# Patient Record
Sex: Male | Born: 1975 | Race: White | Hispanic: No | Marital: Married | Smoking: Former smoker
Health system: Southern US, Community
[De-identification: ages and names within clinical notes are randomized; demographics above are authoritative.]

## PROBLEM LIST (undated history)

## (undated) DIAGNOSIS — F329 Major depressive disorder, single episode, unspecified: Secondary | ICD-10-CM

## (undated) DIAGNOSIS — G2581 Restless legs syndrome: Secondary | ICD-10-CM

## (undated) DIAGNOSIS — I319 Disease of pericardium, unspecified: Secondary | ICD-10-CM

## (undated) DIAGNOSIS — E785 Hyperlipidemia, unspecified: Secondary | ICD-10-CM

## (undated) DIAGNOSIS — F32A Depression, unspecified: Secondary | ICD-10-CM

## (undated) DIAGNOSIS — F419 Anxiety disorder, unspecified: Secondary | ICD-10-CM

## (undated) HISTORY — PX: OTHER SURGICAL HISTORY: SHX169

## (undated) HISTORY — DX: Restless legs syndrome: G25.81

## (undated) HISTORY — DX: Depression, unspecified: F32.A

## (undated) HISTORY — DX: Major depressive disorder, single episode, unspecified: F32.9

## (undated) HISTORY — PX: VASECTOMY: SHX75

## (undated) HISTORY — DX: Disease of pericardium, unspecified: I31.9

## (undated) HISTORY — DX: Hyperlipidemia, unspecified: E78.5

## (undated) HISTORY — DX: Anxiety disorder, unspecified: F41.9

---

## 2007-03-28 ENCOUNTER — Ambulatory Visit: Payer: Self-pay | Admitting: Family Medicine

## 2007-03-28 DIAGNOSIS — N509 Disorder of male genital organs, unspecified: Secondary | ICD-10-CM | POA: Insufficient documentation

## 2007-03-28 DIAGNOSIS — G2581 Restless legs syndrome: Secondary | ICD-10-CM | POA: Insufficient documentation

## 2007-03-30 ENCOUNTER — Encounter: Payer: Self-pay | Admitting: Family Medicine

## 2007-04-01 LAB — CONVERTED CEMR LAB
Albumin: 5.1 g/dL (ref 3.5–5.2)
BUN: 11 mg/dL (ref 6–23)
CO2: 24 meq/L (ref 19–32)
Calcium: 9.3 mg/dL (ref 8.4–10.5)
Chloride: 104 meq/L (ref 96–112)
Creatinine, Ser: 0.89 mg/dL (ref 0.40–1.50)
Glucose, Bld: 89 mg/dL (ref 70–99)
HCT: 44.6 % (ref 39.0–52.0)
Hemoglobin: 14.9 g/dL (ref 13.0–17.0)
RBC: 4.97 M/uL (ref 4.22–5.81)
WBC: 4.8 10*3/uL (ref 4.0–10.5)

## 2007-04-02 ENCOUNTER — Encounter: Payer: Self-pay | Admitting: Family Medicine

## 2007-04-04 LAB — CONVERTED CEMR LAB: TSH: 2.048 microintl units/mL (ref 0.350–5.50)

## 2010-01-10 ENCOUNTER — Ambulatory Visit: Payer: Self-pay | Admitting: Family Medicine

## 2010-01-11 ENCOUNTER — Telehealth: Payer: Self-pay | Admitting: Family Medicine

## 2010-02-08 ENCOUNTER — Ambulatory Visit: Payer: Self-pay | Admitting: Family Medicine

## 2010-02-08 DIAGNOSIS — M79609 Pain in unspecified limb: Secondary | ICD-10-CM | POA: Insufficient documentation

## 2010-02-09 ENCOUNTER — Encounter: Payer: Self-pay | Admitting: Family Medicine

## 2010-11-23 NOTE — Progress Notes (Signed)
Summary: not feeling any better  Phone Note Call from Patient Call back at Home Phone [phone]   Caller: Patient Call For: Nani Gasser MD Summary of Call: pt calls and says he is not feeling any better- right side nasal passage bleeding more, cheeks sore. Told to call Initial call taken by: Kathlene November,  January 11, 2010 2:58 PM  Follow-up for Phone Call        Rx Called In Follow-up by: Nani Gasser MD,  January 11, 2010 3:30 PM  Additional Follow-up for Phone Call Additional follow up Details #1::        Pt notified. Additional Follow-up by: Kathlene November,  January 11, 2010 3:58 PM    New/Updated Medications: AMOXICILLIN 500 MG CAPS (AMOXICILLIN) Take 1 tablet by mouth three times a day for 10 days Prescriptions: AMOXICILLIN 500 MG CAPS (AMOXICILLIN) Take 1 tablet by mouth three times a day for 10 days  #30 x 0   Entered and Authorized by:   Nani Gasser MD   Signed by:   Nani Gasser MD on 01/11/2010   Method used:   Electronically to        [facility]       [address]       [place], [place]  [address]       Ph: [phone]       Fax: [phone]   RxID:   [id number]

## 2010-11-23 NOTE — Consult Note (Signed)
Summary: [facility]  [facility]   Imported By: Lanelle Bal 02/17/2010 10:58:45  _____________________________________________________________________  External Attachment:    Type:   Image     Comment:   External Document

## 2010-11-23 NOTE — Assessment & Plan Note (Signed)
Summary: URI   Vital Signs:  Patient profile:   35 year old male Height:      70 inches Weight:      164 pounds BMI:     23.62 O2 Sat:      100 % on Room air Temp:     98.5 degrees F oral Pulse rate:   71 / minute BP sitting:   103 / 57  (left arm) Cuff size:   large  Vitals Entered By: Kathlene November (January 10, 2010 10:57 AM)  O2 Flow:  Room air CC: sinus congestion, sinus pressure, bodyaches, H/A, sore throat, cough for 6 days now   Primary Care Provider:  Linford Arnold, C  CC:  sinus congestion, sinus pressure, bodyaches, H/A, sore throat, and cough for 6 days now.  History of Present Illness: Dale Hicks is a 35 year old male presenting with sore throat, runny nose, cold chills, HA, cough, muscle aches x 6 days. He has been bringing up some green phlegm with his cough and has had some blood when blowing his nose. Sinus pressure and frontal headache. No fever to his knowledge. His son recently had his first day of daycare. Was congested but felt better after 5 days. Using dayquail for symptom relief.  Throat is better.    Current Medications (verified): 1)  Requip 0.25 Mg  Tabs (Ropinirole Hydrochloride) .... Take 1 Tablet By Mouth Once A Day At Bedtime  Allergies (verified): No Known Drug Allergies  Comments:  Nurse/Medical Assistant: The patient's medications and allergies were reviewed with the patient and were updated in the Medication and Allergy Lists. Kathlene November (January 10, 2010 10:59 AM)  Physical Exam  General:  Pleasant well-appearing male in no acute distress.  Head:  Normocephalic and atraumatic. Male-pattern balding.  Eyes:  Sclera clear with no corneal or conjunctival inflammation noted.  Ears:  Otoscopic examination reveals clear canals, tympanic membranes are intact bilaterally without bulging, retraction, inflammation or discharge. Hearing is grossly normal bilaterally. Nose:  Nasal turbinates slightly erythematous and swollen. Clear discharge. No maxillary or  frontal sinus tenderness.  Mouth:  Oral mucosa and oropharynx without lesions or exudates. Neck:  Supple with no lymphadenopathy.  Lungs:  Normal respiratory effort, chest expands symmetrically. Lungs are clear to auscultation, no crackles or wheezes. Heart:  Normal rate and regular rhythm. S1 and S2 normal without gallop, murmur, click, rub or other extra sounds. Pulses:  2+ radial pulses bilaterally.  Skin:  no rashes.   Cervical Nodes:  No lymphadenopathy noted Psych:  Interacting appropriately with normal concentration and attention.    Impression & Recommendations:  Problem # 1:  ACUTE NASOPHARYNGITIS (ICD-460) Acute illness with likely viral etiology. Advised patient to use vaseline or saline spray to prevent nasal dryness and bleeding. Use Tylenol or Motrin for HAs and body aches. Follow up if symptoms worsen over the next week.   Complete Medication List: 1)  Requip 0.25 Mg Tabs (Ropinirole hydrochloride) .... Take 1 tablet by mouth once a day at bedtime

## 2010-11-23 NOTE — Assessment & Plan Note (Signed)
Summary: Wants custom inserts.    Vital Signs:  Patient profile:   35 year old male Height:      70 inches Weight:      160 pounds Pulse rate:   67 / minute BP sitting:   104 / 59  (left arm) Cuff size:   regular  Vitals Entered By: Kathlene November (February 08, 2010 11:08 AM) CC: needs referral to podiatrists for shoe inserts   Primary Care Provider:  Linford Arnold, C  CC:  needs referral to podiatrists for shoe inserts.  History of Present Illness: needs referral to podiatrists for shoe inserts.  Went to the [employer] and they recommend custom inserts. Recently started running and now getting shin splints.  Running about  4 miles a day.  Had to stop because of shin splints. Occ knee pain.    Current Medications (verified): 1)  None  Allergies (verified): No Known Drug Allergies  Comments:  Nurse/Medical Assistant: The patient's medications and allergies were reviewed with the patient and were updated in the Medication and Allergy Lists. Kathlene November (February 08, 2010 11:09 AM)   Impression & Recommendations:  Problem # 1:  FOOT PAIN, BILATERAL (ICD-729.5)  Dsicussed that I think he would be best suited to sports medicine referral for custom inserts. Will refer to [facility] in [facility].    Orders: Sports Medicine (Sports Med)

## 2010-12-06 ENCOUNTER — Ambulatory Visit
Admission: RE | Admit: 2010-12-06 | Discharge: 2010-12-06 | Disposition: A | Discharge status: Home / Self Care | Payer: 59 | Source: Ambulatory Visit | Attending: Family Medicine | Admitting: Family Medicine

## 2010-12-06 ENCOUNTER — Encounter: Payer: Self-pay | Admitting: Family Medicine

## 2010-12-06 ENCOUNTER — Other Ambulatory Visit: Payer: Self-pay | Admitting: Family Medicine

## 2010-12-06 ENCOUNTER — Ambulatory Visit (INDEPENDENT_AMBULATORY_CARE_PROVIDER_SITE_OTHER): Payer: Self-pay | Admitting: Family Medicine

## 2010-12-06 DIAGNOSIS — R0602 Shortness of breath: Secondary | ICD-10-CM

## 2010-12-06 LAB — CONVERTED CEMR LAB
Hemoglobin: 14.8 g/dL (ref 13.0–17.0)
Lymphocytes Relative: 35 % (ref 12–46)
Monocytes Absolute: 0.5 10*3/uL (ref 0.1–1.0)
Monocytes Relative: 9 % (ref 3–12)
Neutro Abs: 3.4 10*3/uL (ref 1.7–7.7)
Neutrophils Relative %: 56 % (ref 43–77)
RBC: 4.82 M/uL (ref 4.22–5.81)

## 2010-12-07 LAB — CONVERTED CEMR LAB
ALT: 49 units/L (ref 0–53)
BUN: 12 mg/dL (ref 6–23)
CO2: 30 meq/L (ref 19–32)
Creatinine, Ser: 0.84 mg/dL (ref 0.40–1.50)
Total Bilirubin: 0.5 mg/dL (ref 0.3–1.2)

## 2010-12-14 NOTE — Assessment & Plan Note (Signed)
Summary: shortness of breath   Vital Signs:  Patient profile:   35 year old male Height:      70 inches Weight:      164 pounds BMI:     23.62 O2 Sat:      100 % on Room air Temp:     98.2 degrees F oral Pulse rate:   70 / minute BP sitting:   99 / 62  (right arm) Cuff size:   regular  Vitals Entered By: Avon Gully CMA, Duncan Dull) (December 06, 2010 10:11 AM)  O2 Flow:  Room air  Serial Vital Signs/Assessments:  Comments: 10:48 AM [phone] By: Avon Gully CMA, (AAMA)  10:49 AM pt is in the green zone By: Avon Gully CMA, (AAMA)   CC: sob x 1-2 weeks at night or when working out   Primary Care Provider:  Linford Arnold, C  CC:  sob x 1-2 weeks at night or when working out.  History of Present Illness: sob x 1-2 weeks at night or when working out. Feels like in the center of his chest.  Feels like when gets  scared feels like "needs to take a bbreath"  Started initally when had a viral illness. Last couple of week has been worse and has not had any other URI sxs. No daytime sxs  usually except when working out. Happens at night when sitting watching TV.  No change in anxiousness or stress levels.  No prior hx of asthma.  No CP or palpitations. Feels like chset is a little tight when happens. Can take about 20 min for it to go away after working out.  Usually lasts 5-10 mintues.  Taking a deep breath helps some.  NO prior hx of heart problems or disease. Had a stress test at age 3 for CP and that was normal. Was likely pericarditis at that time. NO fever.   Current Medications (verified): 1)  None  Allergies (verified): No Known Drug Allergies  Comments:  Nurse/Medical Assistant: The patient's medications and allergies were reviewed with the patient and were updated in the Medication and Allergy Lists. Avon Gully CMA, Duncan Dull) (December 06, 2010 10:12 AM)  Past History:  Family History: Last updated: 03/28/2007 GF with MI in his 30s Cousin with  DM Mother with cholesterol and HTN.    Physical Exam  General:  Well-developed,well-nourished,in no acute distress; alert,appropriate and cooperative throughout examination Head:  Normocephalic and atraumatic without obvious abnormalities. No apparent alopecia or balding. Eyes:  No corneal or conjunctival inflammation noted. EOMI. Perrla. Ears:  External ear exam shows no significant lesions or deformities.  Otoscopic examination reveals clear canals, tympanic membranes are intact bilaterally without bulging, retraction, inflammation or discharge. Hearing is grossly normal bilaterally. Nose:  External nasal examination shows no deformity or inflammation. Nasal mucosa are pink and moist without lesions or exudates. Mouth:  Oral mucosa and oropharynx without lesions or exudates.  Teeth in good repair. Neck:  No deformities, masses, or tenderness noted. NO tM.  Lungs:  Normal respiratory effort, chest expands symmetrically. Lungs are clear to auscultation, no crackles or wheezes. Heart:  Normal rate and regular rhythm. S1 and S2 normal without gallop, murmur, click, rub or other extra sounds. Pulses:  RAdial 2+ bialt.  Neurologic:  alert & oriented X3.   Skin:  no rashes.   Cervical Nodes:  No lymphadenopathy noted Psych:  Cognition and judgment appear intact. Alert and cooperative with normal attention span and concentration. No apparent delusions, illusions, hallucinations   Impression &  Recommendations:  Problem # 1:  DYSPNEA (ICD-786.05)  Unclear etiology. He does not feel this si stress related. No premature heart dz or lung dz in his family. No prior hx of heart problems or asthma. No URI sxs or fever. No CP.  Will check EKG, peak flows and will get a CXR.  EKG shows rate of 68 bpm, no acute changes, NSR. Peak flows were normal.  If CXR is normla then will see if sxs resolve over the next 2 weeks. Pt to call if gets worse. He voiced his understanding.   If not resolved in a couple of  weeks then consider spirometry and further cardiac workup.  Also consider reflux thought slighl unusual presentation.  Orders: T-Chest x-ray, 2 views (71020) EKG w/ Interpretation (93000) T-CBC w/Diff (60630-16010) T-Comprehensive Metabolic Panel (93235-57322) Peak Flow Rate (94150)  Patient Instructions: 1)  We will call you with the Chest xray report.  2)  Call if your symptoms get worse.    Orders Added: 1)  T-Chest x-ray, 2 views [71020] 2)  Est. Patient Level IV [02542] 3)  EKG w/ Interpretation [93000] 4)  T-CBC w/Diff [70623-76283] 5)  T-Comprehensive Metabolic Panel [80053-22900] 6)  Peak Flow Rate [94150]

## 2010-12-23 ENCOUNTER — Ambulatory Visit (INDEPENDENT_AMBULATORY_CARE_PROVIDER_SITE_OTHER): Payer: 59 | Admitting: Family Medicine

## 2010-12-23 ENCOUNTER — Encounter: Payer: Self-pay | Admitting: Family Medicine

## 2010-12-23 DIAGNOSIS — J209 Acute bronchitis, unspecified: Secondary | ICD-10-CM

## 2010-12-23 DIAGNOSIS — R0602 Shortness of breath: Secondary | ICD-10-CM

## 2010-12-29 ENCOUNTER — Telehealth: Payer: Self-pay | Admitting: Family Medicine

## 2010-12-29 NOTE — Assessment & Plan Note (Signed)
Summary: F/u on previous Visit for S.o.B.    Vital Signs:  Patient profile:   35 year old male Height:      70 inches Weight:      164 pounds BMI:     23.62 O2 Sat:      100 % on Room air Pulse rate:   90 / minute BP sitting:   107 / 66  (right arm)  Vitals Entered By: Payton Spark CMA (December 23, 2010 8:19 AM)  O2 Flow:  Room air CC: F/U SOB   Primary Care Provider:  Linford Arnold, C  CC:  F/U SOB.  History of Present Illness: SOB is better.  Still feeling some tightness in the center of his chest.  Occ feels like in the epigastric area. Yesterday felt it all morning.  No pain. No fever. Gets a litte out of braeth at the end of a long sentence.  Has had a little bit of heart burna nd boutht some TUMS.   Allergies: No Known Drug Allergies  Physical Exam  General:  Well-developed,well-nourished,in no acute distress; alert,appropriate and cooperative throughout examination Mouth:  Oral mucosa and oropharynx without lesions or exudates.  Teeth in good repair. Lungs:  Normal respiratory effort, chest expands symmetrically. Lungs are clear to auscultation, no crackles or wheezes. Heart:  Normal rate and regular rhythm. S1 and S2 normal without gallop, murmur, click, rub or other extra sounds. Skin:  no rashes.   Cervical Nodes:  No lymphadenopathy noted Psych:  Cognition and judgment appear intact. Alert and cooperative with normal attention span and concentration. No apparent delusions, illusions, hallucinations   Impression & Recommendations:  Problem # 1:  ACUTE BRONCHITIS (ICD-466.0) Will tx with Zpack.  He did have bronchitis on CXR and we held off on ABX as is usually viral but he is only a little better. Also will consider tiral of PPI. Given 5 days of dexilant and he is now having occ epigastric discomfort and more recently reflus sxs.  His updated medication list for this problem includes:    Zithromax Z-pak 250 Mg Tabs (Azithromycin) .[facility]... Take as directed.  Problem #  2:  DYSPNEA (ICD-786.05) Will tx with Zpack.  He did have bronchitis on CXR and we held off on ABX as is usually viral but he is only a little better. Also will consider tiral of PPI. Given 5 days of dexilant and he is now having occ epigastric discomfort and more recently reflus sxs.  Call fi not better in one week.    Complete Medication List: 1)  Zithromax Z-pak 250 Mg Tabs (Azithromycin) .... Take as directed.  Patient Instructions: 1)  Can start the dexilant sample once a day in the AM with breakfast.  2)  Call if not better in one week.  Prescriptions: ZITHROMAX Z-PAK 250 MG TABS (AZITHROMYCIN) Take as directed.  #1 pack x 0   Entered and Authorized by:   Nani Gasser MD   Signed by:   Nani Gasser MD on 12/23/2010   Method used:   Electronically to        [facility]       [address]       [place], [place]  [address]       Ph: [phone]       Fax: [phone]   RxID:   [id number]    Orders Added: 1)  Est. Patient Level III [84132]

## 2011-01-03 NOTE — Progress Notes (Signed)
Summary: No better  Phone Note Call from Patient   Caller: Patient Summary of Call: Pt LMOM stating he is still not feeling any better and would like referral(s) that you suggested at last OV. Please advise. Initial call taken by: Payton Spark CMA,  December 29, 2010 2:27 PM

## 2011-01-05 ENCOUNTER — Ambulatory Visit: Payer: 59 | Admitting: Family Medicine

## 2011-01-13 ENCOUNTER — Telehealth: Payer: Self-pay | Admitting: *Deleted

## 2011-01-13 NOTE — Telephone Encounter (Signed)
Call pt: Why does he want his thyroid checked. I have to have a reason or "code:  To order it or insurance won't pay

## 2011-01-13 NOTE — Telephone Encounter (Signed)
Pt LMOM requesting to have thyroid checked. Please advise.

## 2011-01-16 NOTE — Telephone Encounter (Signed)
Called and left message for pt to call back with reason and that he may need appt to discusss why  He need thyroid checked

## 2011-01-24 ENCOUNTER — Encounter: Payer: Self-pay | Admitting: Pulmonary Disease

## 2011-01-24 ENCOUNTER — Ambulatory Visit (INDEPENDENT_AMBULATORY_CARE_PROVIDER_SITE_OTHER): Payer: 59 | Admitting: Pulmonary Disease

## 2011-01-24 DIAGNOSIS — R0602 Shortness of breath: Secondary | ICD-10-CM

## 2011-01-24 NOTE — Patient Instructions (Addendum)
Your breathing test is normal No cause is apparent for your symptoms Take zyrtec for allergies x 4 weeks If worse or persistent symptoms , call back in 1 month

## 2011-01-24 NOTE — Progress Notes (Signed)
  Subjective:    Patient ID: Dale Hicks, male    DOB: October 04, 1976, 35 y.o.   MRN: [id number]  HPI 34/M, ex smoker, for evaluation of dyspnea Difficulty breathing in , shortness of breath better, feels foggy almost every day, has not been to the gym in 3 weeks Sudden onset, bought a '79 camero CXR 12/06/10 nml  Seen in Feb'12  >> sob x 1-2 weeks at night or when working out. Feels like in the center of his chest. Feels like when gets scared feels like "needs to take a bbreath" Started initally when had a viral illness. Feels like chset is a little tight when happens. Can take about 20 min for it to go away after working out. Usually lasts 5-10 mintues. Taking a deep breath helps some. NO prior hx of heart problems or disease. Had a stress test at age 44 for CP and that was normal. Was likely pericarditis at that time.>> CXR- peribronchial thickening, reviewed   March >> z-pak & PPI for GERD - no relief  Spirometry nml     Review of Systems  Constitutional: Negative for fever, appetite change and unexpected weight change.  HENT: Negative for ear pain, congestion, sore throat, rhinorrhea, sneezing, trouble swallowing, dental problem and postnasal drip.   Eyes: Negative for redness.  Respiratory: Positive for shortness of breath. Negative for cough and wheezing.   Cardiovascular: Negative for chest pain, palpitations and leg swelling.  Gastrointestinal: Negative for nausea, vomiting, abdominal pain and diarrhea.  Genitourinary: Negative for dysuria and urgency.  Musculoskeletal: Negative for joint swelling.  Skin: Negative for rash.  Neurological: Positive for weakness. Negative for syncope and headaches.  Hematological: Does not bruise/bleed easily.  Psychiatric/Behavioral: Negative for dysphoric mood. The patient is not nervous/anxious.        Objective:   Physical Exam Gen. Pleasant, well-nourished, in no distress, normal affect ENT - no lesions, no post nasal drip Neck: No JVD,  no thyromegaly, no carotid bruits Lungs: no use of accessory muscles, no dullness to percussion, clear without rales or rhonchi  Cardiovascular: Rhythm regular, heart sounds  normal, no murmurs or gallops, no peripheral edema Abdomen: soft and non-tender, no hepatosplenomegaly, BS normal. Musculoskeletal: No deformities, no cyanosis or clubbing Neuro:  alert, non focal        Assessment & Plan:

## 2011-01-24 NOTE — Assessment & Plan Note (Signed)
No obvious cause - doubt asthma- no airway obstruction CXr nml No obvious GERD, upper airway issues No risk factors for PE, doubt this represents cardiac disease He does not report obvious stressors or anxiety. Best strategy may be to observe at this time,use zyrtec for allergies.  he will keep Peak flow record & call us back for persistent symptoms in 1  month

## 2011-01-24 NOTE — Progress Notes (Signed)
  Subjective:    Patient ID: Dale Hicks, male    DOB: May 16, 1976, 35 y.o.   MRN: [id number]  HPI    Review of Systems  Constitutional: Positive for fatigue. Negative for fever, appetite change and unexpected weight change.  HENT: Negative for ear pain, congestion, sore throat, rhinorrhea, sneezing, trouble swallowing, dental problem and postnasal drip.   Eyes: Negative for redness.  Respiratory: Positive for cough and shortness of breath. Negative for wheezing.   Cardiovascular: Positive for chest pain. Negative for palpitations and leg swelling.  Gastrointestinal: Positive for abdominal pain. Negative for nausea, vomiting and diarrhea.  Genitourinary: Negative for dysuria and urgency.  Musculoskeletal: Negative for joint swelling.  Skin: Negative for rash.  Neurological: Positive for light-headedness. Negative for syncope and headaches.  Hematological: Does not bruise/bleed easily.  Psychiatric/Behavioral: Negative for dysphoric mood. The patient is not nervous/anxious.        Objective:   Physical Exam        Assessment & Plan:

## 2011-02-14 ENCOUNTER — Other Ambulatory Visit: Payer: Self-pay | Admitting: Family Medicine

## 2011-02-14 ENCOUNTER — Ambulatory Visit (INDEPENDENT_AMBULATORY_CARE_PROVIDER_SITE_OTHER): Payer: 59 | Admitting: Family Medicine

## 2011-02-14 ENCOUNTER — Encounter: Payer: Self-pay | Admitting: Family Medicine

## 2011-02-14 VITALS — BP 117/67 | HR 78 | Ht 69.5 in | Wt 163.0 lb

## 2011-02-14 DIAGNOSIS — R5381 Other malaise: Secondary | ICD-10-CM

## 2011-02-14 DIAGNOSIS — R5383 Other fatigue: Secondary | ICD-10-CM | POA: Insufficient documentation

## 2011-02-14 DIAGNOSIS — R42 Dizziness and giddiness: Secondary | ICD-10-CM | POA: Insufficient documentation

## 2011-02-14 NOTE — Progress Notes (Signed)
  Subjective:    Patient ID: Dale Hicks, male    DOB: 01-Dec-1975, 35 y.o.   MRN: [id number]  HPI Feels foggy headed. Fatigued.  For the first hour feels ok.  Then starts to feel spacy and not focused.  Hard to concentrate.  Vision changes a little bit.  Feels almost like being groggy all day.  No snoring. No OTC meds but occ advil. This has been happening for a couple of months.  It is daily. He did see pulm for his SOB.  Had normal spirometry. Eyes will start to water some.  Not snoring. More irritable. Maybe not a happy or sad, just in the middle. His mom would like his thyroid checked.  He doesn't feel stressed, but says his sister feels he is overly stressed. No numbness or tingling or weakness in his arms.     Review of Systems     Objective:   Physical Exam  Constitutional: He is oriented to person, place, and time. He appears well-developed and well-nourished.  HENT:  Head: Normocephalic and atraumatic.  Right Ear: External ear normal.  Left Ear: External ear normal.  Nose: Nose normal.  Mouth/Throat: Oropharynx is clear and moist.  Eyes: Conjunctivae and EOM are normal. Pupils are equal, round, and reactive to light.  Neck: Neck supple. No thyromegaly present.  Cardiovascular: Normal rate, regular rhythm and normal heart sounds.        Radial pulse 2+ bilat.  No carotid bruits.   Pulmonary/Chest: Effort normal and breath sounds normal.  Lymphadenopathy:    He has no cervical adenopathy.  Neurological: He is alert and oriented to person, place, and time. He has normal strength and normal reflexes. He displays normal reflexes. No cranial nerve deficit. He exhibits normal muscle tone. He displays a negative Romberg sign. Coordination and gait normal.  Skin: Skin is warm and dry.  Psychiatric: He has a normal mood and affect. His behavior is normal. Judgment and thought content normal.          Assessment & Plan:

## 2011-02-14 NOTE — Assessment & Plan Note (Signed)
Discussed that I am unclear about the cause of his feeling "foggy" and unfocused. Discussed this could be related to depression.  It doesn't sound consistent with a brain tumor or sinus infection.  I think this is his overall mood. Will get labs to check his thyroid and rule out anemia but if normal then consider treating depression to see if he feels better. I also explained that the sensation of SOB can also be related to his mood as well, esp since had normal spirometry.

## 2011-02-14 NOTE — Patient Instructions (Signed)
WE will call you with your lab results. Follow up in 2 weeks.

## 2011-02-14 NOTE — Assessment & Plan Note (Addendum)
Discussed that this is not vertigo. It is a near syncopal sensation that is not positional or orthostatic.  Since it consistantly happens when he gets on the highway to drive I really think this is anxiety.

## 2011-02-15 ENCOUNTER — Encounter: Payer: Self-pay | Admitting: Pulmonary Disease

## 2011-02-16 ENCOUNTER — Telehealth: Payer: Self-pay | Admitting: Family Medicine

## 2011-02-16 LAB — COMPLETE METABOLIC PANEL WITH GFR
ALT: 77 U/L — ABNORMAL HIGH (ref 0–53)
Albumin: 5.1 g/dL (ref 3.5–5.2)
CO2: 29 mEq/L (ref 19–32)
Calcium: 9.9 mg/dL (ref 8.4–10.5)
Chloride: 101 mEq/L (ref 96–112)
GFR, Est African American: >60 mL/min (ref >60)
GFR, Est Non African American: >60 mL/min (ref >60)
Glucose, Bld: 78 mg/dL (ref 70–99)
Sodium: 140 mEq/L (ref 135–145)
Total Bilirubin: 0.6 mg/dL (ref 0.3–1.2)
Total Protein: 7 g/dL (ref 6.0–8.3)

## 2011-02-16 LAB — VITAMIN B12: Vitamin B-12: 654 pg/mL (ref 211–911)

## 2011-02-16 LAB — CBC
Hemoglobin: 14.4 g/dL (ref 13.0–17.0)
MCH: 29.4 pg (ref 26.0–34.0)
MCHC: 33.2 g/dL (ref 30.0–36.0)
Platelets: 227 10*3/uL (ref 150–400)
RDW: 12.9 % (ref 11.5–15.5)

## 2011-02-16 LAB — TSH: TSH: 2.234 u[IU]/mL (ref 0.350–4.500)

## 2011-02-16 LAB — VITAMIN D 25 HYDROXY (VIT D DEFICIENCY, FRACTURES): Vit D, 25-Hydroxy: 41 ng/mL (ref 30–89)

## 2011-02-16 NOTE — Telephone Encounter (Signed)
Call pt: Liver enzymes are a little high. Avoid all IBU, Advil, Motrin, Aleve products and alcohol nad recheck in 2 wk. Also call lab and add acute hep panel.

## 2011-02-20 NOTE — Telephone Encounter (Signed)
Pt notified and  Labs added

## 2011-02-21 LAB — HEPATITIS PANEL, ACUTE
Hep A IgM: NEGATIVE
Hep B C IgM: NEGATIVE
Hepatitis B Surface Ag: NEGATIVE

## 2011-02-22 ENCOUNTER — Telehealth: Payer: Self-pay | Admitting: Family Medicine

## 2011-02-22 NOTE — Telephone Encounter (Signed)
Pt.notified

## 2011-02-22 NOTE — Telephone Encounter (Signed)
Call pt: Neg acute hep panel. Let recheck liver this week or next.

## 2011-02-24 ENCOUNTER — Telehealth: Payer: Self-pay | Admitting: Family Medicine

## 2011-02-24 ENCOUNTER — Other Ambulatory Visit: Payer: 59

## 2011-02-24 DIAGNOSIS — R748 Abnormal levels of other serum enzymes: Secondary | ICD-10-CM

## 2011-02-24 NOTE — Telephone Encounter (Signed)
Pt wants to come in today to do labs.  He wants [place] to let him know if this okay .  Would like to come today.  Can you get lab order ready and contact patient. Plan:  Routed to Dr. Elder Cyphers, CMA Jarvis Newcomer, LPN Domingo Dimes

## 2011-02-24 NOTE — Telephone Encounter (Signed)
Order entered and pt can come today.notified pt

## 2011-02-25 LAB — COMPLETE METABOLIC PANEL WITH GFR
AST: 37 U/L (ref 0–37)
Albumin: 4.9 g/dL (ref 3.5–5.2)
Alkaline Phosphatase: 89 U/L (ref 39–117)
BUN: 12 mg/dL (ref 6–23)
GFR, Est Non African American: >60 mL/min (ref >60)
Potassium: 4 mEq/L (ref 3.5–5.3)
Sodium: 142 mEq/L (ref 135–145)
Total Bilirubin: 0.5 mg/dL (ref 0.3–1.2)
Total Protein: 7 g/dL (ref 6.0–8.3)

## 2011-02-27 ENCOUNTER — Telehealth: Payer: Self-pay | Admitting: Family Medicine

## 2011-02-27 NOTE — Telephone Encounter (Signed)
Call patient: Overall liver enzymes look much better. When in sinus tachycardia normal a second enzyme is almost down to normal. Let's recheck in one month. To make sure stable.

## 2011-02-28 ENCOUNTER — Ambulatory Visit: Payer: 59 | Admitting: Pulmonary Disease

## 2011-02-28 ENCOUNTER — Ambulatory Visit (INDEPENDENT_AMBULATORY_CARE_PROVIDER_SITE_OTHER): Payer: 59 | Admitting: Pulmonary Disease

## 2011-02-28 ENCOUNTER — Encounter: Payer: Self-pay | Admitting: Pulmonary Disease

## 2011-02-28 VITALS — BP 108/70 | HR 89 | Ht 70.0 in | Wt 160.0 lb

## 2011-02-28 DIAGNOSIS — R0602 Shortness of breath: Secondary | ICD-10-CM

## 2011-02-28 NOTE — Assessment & Plan Note (Signed)
No obvious cause - doubt asthma- no airway obstruction  CXr nml  No obvious GERD, upper airway issues  No risk factors for PE but will obtain CT angio for persistent symptoms, doubt this represents cardiac disease  If negative, focus on anxiety as a cause

## 2011-02-28 NOTE — Progress Notes (Signed)
  Subjective:    Patient ID: Dale Hicks, male    DOB: 01-27-1976, 35 y.o.   MRN: [id number]  HPI 34/M, ex smoker, for evaluation of dyspnea  Difficulty breathing in , shortness of breath better, feels foggy almost every day, has not been to the gym in 3 weeks  Sudden onset, bought a '79 camero  CXR 12/06/10 nml  Seen in Feb'12 >> sob x 1-2 weeks at night or when working out. Feels like in the center of his chest. Feels like when gets scared feels like "needs to take a breath" Started initally when had a viral illness. Feels like chset is a little tight when happens. Can take about 20 min for it to go away after working out. Usually lasts 5-10 mintues. Taking a deep breath helps some. NO prior hx of heart problems or disease. Had a stress test at age 62 for CP and that was normal. Was likely pericarditis at that time.>> CXR- peribronchial thickening, reviewed  March >> z-pak & PPI for GERD - no relief  Spirometry nml   02/28/2011 Zyrtec did not help Did not bring PF record but PF remained in the 450 range, continues to c/o chest heaviness, difficulty in taking a deep breath & head feels like in a 'fog ' as if he is lacking oxygen. He does not report obvious stressors or anxiety.  Did not desatn on walking around the office     Review of Systems Pt denies any significant  nasal congestion or excess secretions, fever, chills, sweats, unintended wt loss, pleuritic or exertional cp, orthopnea pnd or leg swelling.  Pt also denies any obvious fluctuation in symptoms with weather or environmental change or other alleviating or aggravating factors.    Pt denies any increase in rescue therapy over baseline, denies waking up needing it or having early am exacerbations or coughing/wheezing/ or dyspnea       Objective:   Physical Exam Gen. Pleasant, well-nourished, in no distress ENT - no lesions, no post nasal drip Neck: No JVD, no thyromegaly, no carotid bruits Lungs: no use of accessory muscles,  no dullness to percussion, clear without rales or rhonchi  Cardiovascular: Rhythm regular, heart sounds  normal, no murmurs or gallops, no peripheral edema Musculoskeletal: No deformities, no cyanosis or clubbing          Assessment & Plan:

## 2011-02-28 NOTE — Patient Instructions (Addendum)
Ambulatory oxygen levels were good CT scan of chest to look for blood clots If OK, would suggest treatment trial for anxiety & stress control

## 2011-03-01 ENCOUNTER — Ambulatory Visit (INDEPENDENT_AMBULATORY_CARE_PROVIDER_SITE_OTHER)
Admission: RE | Admit: 2011-03-01 | Discharge: 2011-03-01 | Disposition: A | Discharge status: Home / Self Care | Payer: 59 | Source: Ambulatory Visit | Attending: Pulmonary Disease | Admitting: Pulmonary Disease

## 2011-03-01 ENCOUNTER — Telehealth: Payer: Self-pay | Admitting: Pulmonary Disease

## 2011-03-01 DIAGNOSIS — R0602 Shortness of breath: Secondary | ICD-10-CM

## 2011-03-01 MED ORDER — IOHEXOL 300 MG/ML  SOLN
100.0000 mL | Freq: Once | INTRAMUSCULAR | Status: AC | PRN
  Administered 2011-03-01: 100 mL via INTRAVENOUS

## 2011-03-01 NOTE — Telephone Encounter (Signed)
Pt.notified

## 2011-03-01 NOTE — Telephone Encounter (Signed)
Per [facility] [employer] in [place] call report negative for PE

## 2011-03-07 ENCOUNTER — Encounter: Payer: Self-pay | Admitting: Family Medicine

## 2011-03-07 NOTE — Progress Notes (Signed)
Quick Note:  I informed pt of RA's findings and recommendations. Pt verbalized understanding  ______ 

## 2011-03-07 NOTE — Telephone Encounter (Signed)
Dr. Vassie Loll notified, resulted on report.

## 2011-03-09 ENCOUNTER — Encounter: Payer: Self-pay | Admitting: Family Medicine

## 2011-03-09 ENCOUNTER — Ambulatory Visit (INDEPENDENT_AMBULATORY_CARE_PROVIDER_SITE_OTHER): Payer: 59 | Admitting: Family Medicine

## 2011-03-09 VITALS — BP 106/69 | HR 71 | Wt 161.0 lb

## 2011-03-09 DIAGNOSIS — F341 Dysthymic disorder: Secondary | ICD-10-CM

## 2011-03-09 DIAGNOSIS — F418 Other specified anxiety disorders: Secondary | ICD-10-CM

## 2011-03-09 MED ORDER — CITALOPRAM HYDROBROMIDE 20 MG PO TABS: 20.0000 mg | ORAL_TABLET | Freq: Every day | ORAL | Status: DC

## 2011-03-09 NOTE — Patient Instructions (Signed)
1/2 tab a day for one week, then increase to whole tab.  Follow-up in 3 weeks.

## 2011-03-09 NOTE — Progress Notes (Signed)
  Subjective:    Patient ID: Dale Hicks, male    DOB: 11-05-1975, 35 y.o.   MRN: [id number]  HPI Still feeling foggy and chest discomfort . Not sleeping well.  Restless sleep. Wakse up frequently.  Usually gets about 6 hours of sleep.  No thought sof hurting himself. Never seen a counselor before.   Saw pulmonology who ruled out asthma, GERD, etc.  he had a normal chest x-ray and a normal CT angiogram. They really felt this was related to anxiety so he is here to f/u today. Note he quit working out about 3 weeks ago because of his shortness of breath and chest discomfort. There he says he has restarted the last couple of days. He denies feeling overly stressed. No family history of depression or anxiety. No thoughts of hurting himself.   Review of Systems     Objective:   Physical Exam  Constitutional: He is oriented to person, place, and time. He appears well-developed and well-nourished.  HENT:  Head: Normocephalic and atraumatic.  Cardiovascular: Normal rate, regular rhythm and normal heart sounds.   Pulmonary/Chest: Effort normal and breath sounds normal.  Neurological: He is alert and oriented to person, place, and time.  Skin: Skin is warm and dry.  Psychiatric: He has a normal mood and affect.          Assessment & Plan:

## 2011-03-09 NOTE — Assessment & Plan Note (Signed)
Today his PHQ-9 score was 12 his GAD-7 score with 12. The specimen the moderate category for depression and anxiety. We discussed this diagnosis. We also discussed treatment such as regular exercise, counseling and medication. He said at this time he would suddenly start going back to the gym and he is interested in trying counseling. He says he not sure how much it may help him but he is willing to try to that he can feel better. He was also given a taking a medication. We discussed how SSRIs work and the benefits and risks of taking these medications. I did let him know if he has any thoughts of suicide he is to stop the medication immediately. Otherwise he can followup in 3-4 weeks and he can discuss how he is doing on the medication and adjust his treatment if needed. Patient agrees to current care plan.

## 2011-04-06 ENCOUNTER — Ambulatory Visit (INDEPENDENT_AMBULATORY_CARE_PROVIDER_SITE_OTHER): Payer: 59 | Admitting: Family Medicine

## 2011-04-06 ENCOUNTER — Encounter: Payer: Self-pay | Admitting: Family Medicine

## 2011-04-06 VITALS — BP 106/69 | HR 71 | Ht 70.0 in | Wt 160.0 lb

## 2011-04-06 DIAGNOSIS — R0602 Shortness of breath: Secondary | ICD-10-CM

## 2011-04-06 DIAGNOSIS — R7401 Elevation of levels of liver transaminase levels: Secondary | ICD-10-CM

## 2011-04-06 DIAGNOSIS — F341 Dysthymic disorder: Secondary | ICD-10-CM

## 2011-04-06 DIAGNOSIS — F418 Other specified anxiety disorders: Secondary | ICD-10-CM

## 2011-04-06 DIAGNOSIS — R748 Abnormal levels of other serum enzymes: Secondary | ICD-10-CM

## 2011-04-06 MED ORDER — CITALOPRAM HYDROBROMIDE 20 MG PO TABS: 30.0000 mg | ORAL_TABLET | Freq: Every day | ORAL | Status: DC

## 2011-04-06 NOTE — Assessment & Plan Note (Signed)
I really do not think that this is related to cardiac causes I will go ahead and refer him to cardiology to see if they feel this warrants a stress test. His hearing worried that this could be something besides anxiety. I did offer the option of waiting another month since we are increasing his medication to see if the shortness of breath improves that he would like to ahead and scheduled a cardiology referral.

## 2011-04-06 NOTE — Assessment & Plan Note (Addendum)
PHQ- 9 score of 8, and GAD-7 score of 7 today. Will inc dose to 1.5 tabs.  Discussed options. He has made great progess. F/U in 1 month. He is not interested in counseling at this time. I still think this is probably the primary cause of his shortness of breath.

## 2011-04-06 NOTE — Progress Notes (Signed)
  Subjective:    Patient ID: DEMARCUS THIELKE, male    DOB: [birth year], 35 y.o.   MRN: [id number]  HPI Doing well on teh celexa. No SE currently. Tolerating well. Sleep was better initially but still having difficulty falling asleep.  Feels he sleeps deeper. He is on a whole tab. Did have some inc anxiety the first 3 days he started the medication.  Would get hot flashes but that has resolved.  Felt more fatigued initially.   Starting to get SOB again and foggy headedness.  Says did get a little better initially after starting the medication, but now he feels that it is starting again.Marland Kitchen  Has had full pulm w/u that was negative. No reflux symptoms or stomach upset. Usually happens in the evening and at rest.  It does not happen was watching TV. It does not typically happen with activity. It is not disabling. He wonders if he could have a brain tumor.  Review of Systems     Objective:   Physical Exam  Constitutional: He is oriented to person, place, and time. He appears well-developed and well-nourished.  Cardiovascular: Normal rate, regular rhythm and normal heart sounds.   Pulmonary/Chest: Effort normal and breath sounds normal.  Neurological: He is alert and oriented to person, place, and time.  Skin: Skin is warm and dry.  Psychiatric: He has a normal mood and affect.          Assessment & Plan:  I tried to give him reassurance that it is unlikely for him to have a brain tumor. Typically this was present in other ways such as seizures or severe headaches instead of feeling "foggy" headed.

## 2011-04-07 ENCOUNTER — Telehealth: Payer: Self-pay | Admitting: Family Medicine

## 2011-04-07 DIAGNOSIS — R748 Abnormal levels of other serum enzymes: Secondary | ICD-10-CM

## 2011-04-07 LAB — HEPATIC FUNCTION PANEL
Albumin: 4.9 g/dL (ref 3.5–5.2)
Bilirubin, Direct: 0.1 mg/dL (ref 0.0–0.3)
Total Bilirubin: 0.6 mg/dL (ref 0.3–1.2)

## 2011-04-07 NOTE — Telephone Encounter (Signed)
Call patient: Liver function went back up a little bit. They come down almost to normal last time. I would like to schedule him for an ultrasound of his liver if he has not had this done. Please let me know if I need to enter the order.

## 2011-04-10 ENCOUNTER — Telehealth: Payer: Self-pay | Admitting: Family Medicine

## 2011-04-10 NOTE — Telephone Encounter (Signed)
Advised pt's wife of msg. Said to go ahead and sched U/S. Call [phone] w/appt

## 2011-04-10 NOTE — Telephone Encounter (Signed)
Pt called and is returning call to Gaylyn Lambert who is working with Dr. Linford Arnold today. Plan:  Routed call to Gaylyn Lambert. Jarvis Newcomer, LPN Domingo Dimes

## 2011-04-11 NOTE — Telephone Encounter (Signed)
U/S sched for 6/20 at 9:15 [facility]. Pt notified.

## 2011-04-12 ENCOUNTER — Telehealth: Payer: Self-pay | Admitting: Family Medicine

## 2011-04-12 ENCOUNTER — Ambulatory Visit
Admission: RE | Admit: 2011-04-12 | Discharge: 2011-04-12 | Disposition: A | Discharge status: Home / Self Care | Payer: 59 | Source: Ambulatory Visit | Attending: Family Medicine | Admitting: Family Medicine

## 2011-04-12 DIAGNOSIS — R748 Abnormal levels of other serum enzymes: Secondary | ICD-10-CM

## 2011-04-12 NOTE — Telephone Encounter (Signed)
Call pt: Dale Hicks is nl.  He doesn't have fatty Dale which is one of the most common causes of elevated Dale enzymes in young health people. No evidence of cirrhosis. I would like to get additional bloodwork to rule out other causes like hemochromatosis and thyroid disorder and celiac disease. I will fax down a lab slip and eating at any time. He will need to fast for 8 hours for this bloodwork.

## 2011-04-12 NOTE — Telephone Encounter (Signed)
Pts wife informed of pt lab results since pt not available.  Told need to do other labwork and fast for 8 hours prior.  Pt;s wife will give the instructions to her husband. Jarvis Newcomer, LPN Dom[facility] Dimes

## 2011-04-18 ENCOUNTER — Encounter: Payer: Self-pay | Admitting: *Deleted

## 2011-04-19 ENCOUNTER — Ambulatory Visit (INDEPENDENT_AMBULATORY_CARE_PROVIDER_SITE_OTHER): Payer: 59 | Admitting: Cardiology

## 2011-04-19 ENCOUNTER — Encounter: Payer: Self-pay | Admitting: Cardiology

## 2011-04-19 VITALS — BP 115/69 | HR 69 | Resp 18 | Ht 70.0 in | Wt 161.1 lb

## 2011-04-19 DIAGNOSIS — R0602 Shortness of breath: Secondary | ICD-10-CM

## 2011-04-19 DIAGNOSIS — R079 Chest pain, unspecified: Secondary | ICD-10-CM | POA: Insufficient documentation

## 2011-04-19 DIAGNOSIS — R0600 Dyspnea, unspecified: Secondary | ICD-10-CM

## 2011-04-19 NOTE — Assessment & Plan Note (Signed)
Symptoms atypical. Scheduled ETT.

## 2011-04-19 NOTE — Progress Notes (Signed)
HPI: 35 year old male with no prior cardiac history for evaluation of dyspnea. Chest CTA on Feb 28, 2011 showed no pulmonary embolus and was otherwise unremarkable. Spirometry in April of 2012 was normal. Previous TSH, hemoglobin, renal function and B12 normal. Liver functions mildly elevated. For the past 4 months the patient has had episodes of dyspnea. These occur mainly at rest. There is occasional associated chest tightness in the substernal area but this is not consistent. His symptoms last approximately 5 minutes and resolve spontaneously. No associated nausea or diaphoresis. He states he feels as though he cannot take a deep breath. He also notes increased dyspnea with exercise but no orthopnea, PND or pedal edema. He is not having exertional chest pain. No syncope. He also describes a "foggy sensation" over his eyes continuously. Because of the above we were asked to further evaluate.  Current Outpatient Prescriptions  Medication Sig Dispense Refill  . citalopram (CELEXA) 20 MG tablet Take 1.5 tablets (30 mg total) by mouth daily.  45 tablet  1    No Known Allergies  Past Medical History  Diagnosis Date  . RLS (restless legs syndrome)     hx of  . Pericarditis     Past Surgical History  Procedure Date  . Wisdom teeth removal     History   Social History  . Marital Status: Married    Spouse Name: Artist Pais    Number of Children: 1  . Years of Education: N/A   Occupational History  . IT Analyst     [employer]    Social History Main Topics  . Smoking status: Former Smoker -- 1.0 packs/day for 7 years    Types: Cigarettes    Quit date: 01/23/2010  . Smokeless tobacco: Current User    Types: Chew  . Alcohol Use: No  . Drug Use: No  . Sexually Active: Not on file   Other Topics Concern  . Not on file   Social History Narrative  . No narrative on file    Family History  Problem Relation Age of Onset  . Heart attack      GF in his 55s  . Diabetes Cousin   .  Hyperlipidemia Mother   . Hypertension Mother     ROS: no fevers or chills, productive cough, hemoptysis, dysphasia, odynophagia, melena, hematochezia, dysuria, hematuria, rash, seizure activity, orthopnea, PND, pedal edema, claudication. Remaining systems are negative.  Physical Exam: General:  Well developed/well nourished in NAD Skin warm/dry Patient not depressed No peripheral clubbing Back-normal HEENT-normal/normal eyelids Neck supple/normal carotid upstroke bilaterally; no bruits; no JVD; no thyromegaly chest - CTA/ normal expansion CV - RRR/normal S1 and S2; no murmurs, rubs or gallops;  PMI nondisplaced Abdomen -NT/ND, no HSM, no mass, + bowel sounds, no bruit 2+ femoral pulses, no bruits Ext-no edema, chords, 2+ DP Neuro-grossly nonfocal  ECG Sinus rhythm at a rate of 60, RV conduction delay, no ST changes.

## 2011-04-19 NOTE — Assessment & Plan Note (Signed)
Etiology unclear. Pulmonary functions and chest CTA unremarkable. Check echocardiogram to quantify LV systolic and diastolic function. Patient also with history of pericarditis although doubt constriction. If normal will not pursue further cardiac evaluation for dyspnea.

## 2011-04-19 NOTE — Patient Instructions (Signed)
Your physician has requested that you have an exercise tolerance test. For further information please visit [email or web address]. Please also follow instruction sheet, as given.  Your physician has requested that you have an echocardiogram. Echocardiography is a painless test that uses sound waves to create images of your heart. It provides your doctor with information about the size and shape of your heart and how well your heart's chambers and valves are working. This procedure takes approximately one hour. There are no restrictions for this procedure.   

## 2011-05-04 ENCOUNTER — Encounter: Payer: Self-pay | Admitting: Physician Assistant

## 2011-05-08 ENCOUNTER — Ambulatory Visit (INDEPENDENT_AMBULATORY_CARE_PROVIDER_SITE_OTHER): Payer: 59 | Admitting: Physician Assistant

## 2011-05-08 ENCOUNTER — Ambulatory Visit (HOSPITAL_COMMUNITY): Payer: 59 | Attending: Family Medicine | Admitting: Radiology

## 2011-05-08 DIAGNOSIS — I079 Rheumatic tricuspid valve disease, unspecified: Secondary | ICD-10-CM | POA: Insufficient documentation

## 2011-05-08 DIAGNOSIS — R072 Precordial pain: Secondary | ICD-10-CM

## 2011-05-08 DIAGNOSIS — R0600 Dyspnea, unspecified: Secondary | ICD-10-CM

## 2011-05-08 DIAGNOSIS — R0609 Other forms of dyspnea: Secondary | ICD-10-CM | POA: Insufficient documentation

## 2011-05-08 DIAGNOSIS — R0989 Other specified symptoms and signs involving the circulatory and respiratory systems: Secondary | ICD-10-CM | POA: Insufficient documentation

## 2011-05-08 DIAGNOSIS — R079 Chest pain, unspecified: Secondary | ICD-10-CM

## 2011-05-08 DIAGNOSIS — R5383 Other fatigue: Secondary | ICD-10-CM | POA: Insufficient documentation

## 2011-05-08 DIAGNOSIS — R5381 Other malaise: Secondary | ICD-10-CM | POA: Insufficient documentation

## 2011-05-08 NOTE — Progress Notes (Signed)
Exercise Treadmill Test  Pre-Exercise Testing Evaluation Rhythm: normal sinus  Rate: 62   PR:  .15 QRS:  .07  QT:  .43 QTc: .43           Test  Exercise Tolerance Test Ordering MD: Olga Millers, MD  Interpreting MD:  Tereso Newcomer, PA  Unique Test No: 1 Treadmill:  1  Indication for ETT: chest pain - rule out ischemia  Contraindication to ETT: No   Stress Modality: exercise - treadmill  Cardiac Imaging Performed: non   Protocol: standard Bruce - submaximal  Max BP:  151/90  Max MPHR (bpm):  187 85% MPR (bpm):  158  MPHR obtained (bpm):  176 % MPHR obtained:  94  Reached 85% MPHR (min:sec):  7:16 Total Exercise Time (min-sec):  9:30  Workload in METS:  16.2 Borg Scale: 17  Reason ETT Terminated:  patient's desire to stop    ST Segment Analysis At Rest: normal ST segments - no evidence of significant ST depression With Exercise: no evidence of significant ST depression  Other Information Arrhythmia:  No Angina during ETT:  absent (0) Quality of ETT:  diagnostic  ETT Interpretation:  normal - no evidence of ischemia by ST analysis  Comments: Good exercise tolerance. No chest pain. Normal BP response to exercise. No ST-T changes to suggest ischemia.  Recommendations: Follow up with Dr. Jens Som as directed.

## 2011-05-10 ENCOUNTER — Telehealth: Payer: Self-pay | Admitting: Cardiology

## 2011-05-10 NOTE — Telephone Encounter (Signed)
PT AWARE OF ECHO RESULTS./CY 

## 2011-05-10 NOTE — Telephone Encounter (Signed)
Pt rtn call to get test results °

## 2011-06-07 ENCOUNTER — Ambulatory Visit (INDEPENDENT_AMBULATORY_CARE_PROVIDER_SITE_OTHER): Payer: 59 | Admitting: Family Medicine

## 2011-06-07 ENCOUNTER — Encounter: Payer: Self-pay | Admitting: Family Medicine

## 2011-06-07 VITALS — BP 95/56 | HR 74 | Ht 70.0 in | Wt 164.0 lb

## 2011-06-07 DIAGNOSIS — F329 Major depressive disorder, single episode, unspecified: Secondary | ICD-10-CM

## 2011-06-07 DIAGNOSIS — F32A Depression, unspecified: Secondary | ICD-10-CM

## 2011-06-07 MED ORDER — CITALOPRAM HYDROBROMIDE 40 MG PO TABS: 40.0000 mg | ORAL_TABLET | Freq: Every day | ORAL | Status: DC

## 2011-06-07 NOTE — Progress Notes (Signed)
  Subjective:    Patient ID: Dale Hicks, male    DOB: [birth year], 35 y.o.   MRN: [id number]  HPI Mood - tolerating well. No SE.  OK Still struggling with depression. Not sleeping well. Has noticed some inc urination at night. No dysuria. Still feeling fidgety.  But overall has liked the improvement on the medication.   Went for labs this AM for his liver recheck.   Review of Systems     Objective:   Physical Exam  Constitutional: He is oriented to person, place, and time. He appears well-developed and well-nourished.  HENT:  Head: Normocephalic and atraumatic.  Cardiovascular: Normal rate, regular rhythm and normal heart sounds.   Pulmonary/Chest: Effort normal and breath sounds normal.  Neurological: He is alert and oriented to person, place, and time.  Skin: Skin is warm and dry.  Psychiatric: He has a normal mood and affect. His behavior is normal.          Assessment & Plan:  Depression. PHQ 9 sore of 8, GAD-7 of 6. Dong well but not at goal. Will inc to 40mg . Discussed potential for her s.e. At higher doses.

## 2011-06-08 ENCOUNTER — Telehealth: Payer: Self-pay | Admitting: Family Medicine

## 2011-06-08 LAB — TSH: TSH: 2.12 u[IU]/mL (ref 0.350–4.500)

## 2011-06-08 LAB — IRON AND TIBC
Iron: 105 ug/dL (ref 42–165)
TIBC: 320 ug/dL (ref 215–435)
UIBC: 215 ug/dL

## 2011-06-08 LAB — FERRITIN: Ferritin: 181 ng/mL (ref 22–322)

## 2011-06-08 LAB — GLIA (IGA/G) + TTG IGA: Gliadin IgG: 5.3 U/mL (ref <20)

## 2011-06-08 NOTE — Telephone Encounter (Signed)
Call pt: thyroid, ferritin are normal, and no gluten antibodies.

## 2011-06-20 NOTE — Telephone Encounter (Signed)
Pt.notified

## 2011-08-18 ENCOUNTER — Encounter: Payer: Self-pay | Admitting: Family Medicine

## 2011-08-18 ENCOUNTER — Ambulatory Visit (INDEPENDENT_AMBULATORY_CARE_PROVIDER_SITE_OTHER): Payer: 59 | Admitting: Family Medicine

## 2011-08-18 ENCOUNTER — Telehealth: Payer: Self-pay | Admitting: Family Medicine

## 2011-08-18 VITALS — BP 114/64 | HR 68 | Wt 169.0 lb

## 2011-08-18 DIAGNOSIS — R1013 Epigastric pain: Secondary | ICD-10-CM

## 2011-08-18 DIAGNOSIS — F418 Other specified anxiety disorders: Secondary | ICD-10-CM

## 2011-08-18 DIAGNOSIS — R351 Nocturia: Secondary | ICD-10-CM

## 2011-08-18 DIAGNOSIS — F341 Dysthymic disorder: Secondary | ICD-10-CM

## 2011-08-18 NOTE — Telephone Encounter (Signed)
Dale Hicks can you check to see if we ran a urine on him Friday? If not then will need to get another sample.

## 2011-08-18 NOTE — Progress Notes (Signed)
Subjective:    Patient ID: Dale Hicks, male    DOB: 1976/04/21, 35 y.o.   MRN: [id number]  HPI  Here to f/u mood. _ he is happy with his current regimen  Says he is not happy about the weight gain but he does like how the medications helping him. Otherwise he is tolerating it well without any side effects. He still not sleeping well. He complains of increased appetite.    Does still feel tired a fair amount.  Still waking up a lot at night to urinate, 2-3 times.  No dysuria.  He says he drinks most of this fluid during the daytime and not in the evening. Normal urinary stream. No family hx of prostate d/o. Started about a few months ago.    Epigastric pain for weeks to months.  He says it's a mild discomfort. No burning. Says usually in AM when he wakes up. No nausea or voiting. No change in bowel movements. No blood in the stool. Last 10 min. Does eat a bowl of cereal at bedtime. The family history of GI problems.  Review of Systems     BP 114/64  Pulse 68  Wt 169 lb (76.658 kg)    No Known Allergies  Past Medical History  Diagnosis Date  . RLS (restless legs syndrome)     hx of  . Pericarditis     Past Surgical History  Procedure Date  . Wisdom teeth removal     History   Social History  . Marital Status: Married    Spouse Name: Artist Pais    Number of Children: 1  . Years of Education: N/A   Occupational History  . IT Analyst     [employer]    Social History Main Topics  . Smoking status: Former Smoker -- 1.0 packs/day for 7 years    Types: Cigarettes    Quit date: 01/23/2010  . Smokeless tobacco: Current User    Types: Chew  . Alcohol Use: No  . Drug Use: No  . Sexually Active: Not on file   Other Topics Concern  . Not on file   Social History Narrative   Regular exercise: Yes    Family History  Problem Relation Age of Onset  . Heart attack      GF in his 69s  . Diabetes Cousin   . Hyperlipidemia Mother   . Hypertension Mother     Mr.  Linsley does not currently have medications on file.  Objective:   Physical Exam  Constitutional: He is oriented to person, place, and time. He appears well-developed and well-nourished.  HENT:  Head: Normocephalic and atraumatic.  Eyes: Pupils are equal, round, and reactive to light.  Neck: Neck supple. No thyromegaly present.  Cardiovascular: Normal rate, regular rhythm and normal heart sounds.   Pulmonary/Chest: Effort normal and breath sounds normal.  Abdominal: Soft. Bowel sounds are normal. He exhibits no distension and no mass. There is tenderness. There is no rebound and no guarding.       Mild tenderness in the right lower quadrant  Lymphadenopathy:    He has no cervical adenopathy.  Neurological: He is alert and oriented to person, place, and time.  Skin: Skin is warm and dry.  Psychiatric: He has a normal mood and affect. His behavior is normal.          Assessment & Plan:  Mood - PHQ- 9 score of 6. Continue current regimen. F/U in 2 months. Consider  weaning off at that time. If he would like to dec his dose a little secondary to weight gain just call the office. Otherwise try to eat a more healthy diet and work on regular exercise.   Nocturia - he urinates 2-3 times a night which is very unusual for a 35 year old male. Asked him to give a urine sample before he left today. He could have some early BPH. He says has been going on for quite some time. UA was nl.  Consider referral to Urology for further evaluation.   Epigastric Pain - Bloating. Dicussed could be GERD. Rec trial of PPI. Also consider gluten intolerance or lactose intolerance. Consider blood tests or just trying a diet free of these things and see if feels better. Also rec not to eat bowl of cereal right before bedtime and see if feels better when wakes up in the AM.

## 2011-08-18 NOTE — Patient Instructions (Signed)
Skip eating cereal at bedtime Can try prilosec at bedtime for one week and see if helps Consider gluten and lactose free diet and see if feel better.

## 2011-08-22 NOTE — Telephone Encounter (Signed)
Pt did but i did not know that he needed to get a urine sample. Called pt and he will come here to do another sample at his convienience

## 2011-09-12 ENCOUNTER — Encounter: Payer: Self-pay | Admitting: Family Medicine

## 2011-09-12 ENCOUNTER — Ambulatory Visit (INDEPENDENT_AMBULATORY_CARE_PROVIDER_SITE_OTHER): Payer: 59 | Admitting: Family Medicine

## 2011-09-12 VITALS — BP 109/57 | HR 63 | Temp 98.1°F | Ht 70.0 in | Wt 165.0 lb

## 2011-09-12 DIAGNOSIS — T887XXA Unspecified adverse effect of drug or medicament, initial encounter: Secondary | ICD-10-CM

## 2011-09-12 DIAGNOSIS — R350 Frequency of micturition: Secondary | ICD-10-CM

## 2011-09-12 DIAGNOSIS — F419 Anxiety disorder, unspecified: Secondary | ICD-10-CM

## 2011-09-12 DIAGNOSIS — IMO0001 Reserved for inherently not codable concepts without codable children: Secondary | ICD-10-CM

## 2011-09-12 DIAGNOSIS — F411 Generalized anxiety disorder: Secondary | ICD-10-CM

## 2011-09-12 LAB — POCT URINALYSIS DIPSTICK
Bilirubin, UA: NEGATIVE
Glucose, UA: NEGATIVE
Ketones, UA: NEGATIVE
Leukocytes, UA: NEGATIVE
Nitrite, UA: NEGATIVE
pH, UA: 7

## 2011-09-12 NOTE — Progress Notes (Signed)
  Subjective:    Patient ID: Dale Hicks, male    DOB: [birth year], 35 y.o.   MRN: [id number]  HPI  Patient reports having tingling over his arms hands and feet interesting when he first started taking the Celexa he had tingling in his arms and feet states is different from his anxiety and panic attacks he has while some of the spurs be anxiety he reports just feeling kind of achy all over and just not feeling well as well. States this started last week Thursday about 6 days ago and that would gotten better goes when it was a little worse when he's had this episode occurring off and on. Last Wednesday had this was when he started Celexa and then it lasted  about 2-3 days. History sleep try a gluten-free diet and states that he was told to do that as last visit. #2 history frequency 1 have his room check right ear this is due to he was here last month but somehow didn't get the Bactroban he was called to ask him back have that repeated. Review of Systems  All other systems reviewed and are negative.   BP 109/57  Pulse 63  Temp(Src) 98.1 F (36.7 C) (Oral)  Ht 5\' 10"  (1.778 m)  Wt 165 lb (74.844 kg)  BMI 23.68 kg/m2  SpO2 96%    Objective:   Physical Exam  Constitutional: He appears well-developed and well-nourished.  HENT:  Head: Normocephalic.  Neck: Normal range of motion. Neck supple.  Cardiovascular: Normal rate and regular rhythm.   Pulmonary/Chest: Effort normal.  Skin: Skin is warm and dry. No rash noted.  Psychiatric: He has a normal mood and affect. His behavior is normal.       Some anxiety    Results for orders placed in visit on 09/12/11  POCT URINALYSIS DIPSTICK      Component Value Range   Color, UA yellow     Clarity, UA clear     Glucose, UA neg     Bilirubin, UA neg     Ketones, UA neg     Spec Grav, UA 1.020     Blood, UA neg     pH, UA 7.0     Protein, UA neg     Urobilinogen, UA 0.2     Nitrite, UA neg     Leukocytes, UA neg           Assessment  & Plan:  Symptoms patient is having are similar to the symptoms he had when he first started taking the Celexa. With that in mind I think he is having a reaction to the Celexa and the doses may be too high.Since he has change his diet eating it may be about absorption of the Celexa. Parenchyma think that the Celexa may be an individual better to system and that he is probably having side effects from high levels of Celexa in his  system. Recommend that we try to reduce the Celexa from 40 mg 20. he can cut the tablet in half and with that hopefully the symptoms will clear up and improve. See Dr Linford Arnold in 6-8 weeks.   UA was checked and found to be negative.

## 2011-09-12 NOTE — Patient Instructions (Signed)
Urinary Frequency The number of times a normal person urinates depends upon how much liquid they take in and how much liquid they are losing. If the temperature is hot and there is high humidity then the person will sweat more and usually breathe a little more frequently. These factors decrease the amount of frequency of urination that would be considered normal. The amount you drink is easily determined, but the amount of fluid lost is sometimes more difficult to calculate.  Fluid is lost in two ways:  Sensible fluid loss is usually measured by the amount of urine that you get rid of. Losses of fluid can also occur with diarrhea.   Insensible fluid loss is more difficult to measure. It is caused by evaporation. Insensible loss of fluid occurs through breathing and sweating. It usually ranges from a little less than a quart to a little more than a quart of fluid a day.  In normal temperatures and activity levels the average person may urinate 4 to 7 times in a 24-hour period. Needing to urinate more often than that could indicate a problem. If one urinates 4 to 7 times in 24 hours and has large volumes each time, that could indicate a different problem from one who urinates 4 to 7 times a day and has small volumes. The time of urinating is also an important. Most urinating should be done during the waking hours. Getting up at night to urinate frequently can indicate some problems. CAUSES  The bladder is the organ in your lower abdomen that holds urine. Like a balloon, it swells some as it fills up. Your nerves sense this and tell you it is time to head for the bathroom. There are a number of reasons that you might feel the need to urinate more often than usual. They include:  Urinary tract infection. This is usually associated with other signs such as burning when you urinate.   In men, problems with the prostate (a walnut-size gland that is located near the tube that carries urine out of your body).  There are two reasons why the prostate can cause an increased frequency of urination:   An enlarged prostate that does not let the bladder empty well. If the bladder only half empties when you urinate then it only has half the capacity to fill before you have to urinate again.   The nerves in the bladder become more hypersensitive with an increased size of the prostate even if the bladder empties completely.   Pregnancy.   Obesity. Excess weight is more likely to cause a problem for women more than for men.   Bladder stones or other bladder problems.   Caffeine.   Alcohol.   Medications. For example, drugs that help the body get rid of extra fluid (diuretics) increase urine production. Some other medicines must be taken with lots of fluids.   Muscle or nerve weakness. This might be the result of a spinal cord injury, a stroke, multiple sclerosis or Parkinson's disease.   Long-standing diabetes can decrease the sensation of the bladder. This loss of sensation makes it harder to sense the bladder needs to be emptied. Over a period of years the bladder is stretched out by constant overfilling. This weakens the bladder muscles so that the bladder does not empty well and has less capacity to fill with new urine.   Interstitial cystitis (also called painful bladder syndrome). This condition develops because the tissues that line the insider of the bladder are inflamed (  inflammation is the body's way of reacting to injury or infection). It causes pain and frequent urination. It occurs in women more often than in men.  DIAGNOSIS   To decide what might be causing your urinary frequency, your healthcare provider will probably:   Ask about symptoms you have noticed.   Ask about your overall health. This will include questions about any medications you are taking.   Do a physical examination.   Order some tests. These might include:   A blood test to check for diabetes or other health issues  that could be contributing to the problem.   Urine testing. This could measure the flow of urine and the pressure on the bladder.   A test of your neurological system (the brain, spinal cord and nerves). This is the system that senses the need to urinate.   A bladder test to check whether it is emptying completely when you urinate.   Cytoscopy. This test uses a thin tube with a tiny camera on it. It offers a look inside your urethra and bladder to see if there are problems.   Imaging tests. You might be given a contrast dye and then asked to urinate. X-rays are taken to see how your bladder is working.  TREATMENT  It is important for you to be evaluated to determine if the amount or frequency that you have is unusual or abnormal. If it is found to be abnormal the cause should be determined and this can usually be found out easily. Depending upon the cause treatment could include medication, stimulation of the nerves, or surgery. There are not too many things that you can do as an individual to change your urinary frequency. It is important that you balance the amount of fluid intake needed to compensate for your activity and the temperature. Medical problems will be diagnosed and taken care of by your physician. There is no particular bladder training such as Kegel's exercises that you can do to help urinary frequency. This is an exercise this is usually done for people who have leaking of urine when they laugh cough or sneeze. HOME CARE INSTRUCTIONS   Take any medications your healthcare provider prescribed or suggested. Follow the directions carefully.   Practice any lifestyle changes that are recommended. These might include:   Drinking less fluid or drinking at different times of the day. If you need to urinate often during the night, for example, you may need to stop drinking fluids early in the evening.   Cutting down on caffeine or alcohol. They both can make you need to urinate more  often than normal. Caffeine is found in coffee, tea and sodas.   Losing weight, if that is recommended.   Keep a journal or a log. You might be asked to record how much you drink and when and when you feel the need to urinate. This will also help evaluate how well the treatment provided by your physician is working.  SEEK MEDICAL CARE IF:   Your need to urinate often gets worse.   You feel increased pain or irritation when you urinate.   You notice blood in your urine.   You have questions about any medications that your healthcare provider recommended.   You notice blood, pus or swelling at the site of any test or treatment procedure.   You develop a fever of more than 100.5 F (38.1 C).  SEEK IMMEDIATE MEDICAL CARE IF:  You develop a fever of more than 102.0  F (38.9 C). Document Released: 08/05/2009 Document Revised: 06/21/2011 Document Reviewed: 08/05/2009 [facility] Patient Information 2012 [place], [place].

## 2011-11-21 ENCOUNTER — Ambulatory Visit (INDEPENDENT_AMBULATORY_CARE_PROVIDER_SITE_OTHER): Payer: 59 | Admitting: Family Medicine

## 2011-11-21 ENCOUNTER — Encounter: Payer: Self-pay | Admitting: Family Medicine

## 2011-11-21 VITALS — BP 109/63 | HR 71 | Temp 98.3°F | Wt 167.0 lb

## 2011-11-21 DIAGNOSIS — G44209 Tension-type headache, unspecified, not intractable: Secondary | ICD-10-CM

## 2011-11-21 DIAGNOSIS — F419 Anxiety disorder, unspecified: Secondary | ICD-10-CM

## 2011-11-21 DIAGNOSIS — F411 Generalized anxiety disorder: Secondary | ICD-10-CM

## 2011-11-21 NOTE — Progress Notes (Signed)
  Subjective:    Patient ID: Dale Hicks, male    DOB: [birth year], 36 y.o.   MRN: [id number]  HPI Anxiety-overall he is doing real well. He is happy with his current regimen. He was seen approximately 2 months ago by Dr. Thurmond Butts and it decreased to spelled him from 40 mg to 20 mg because he was feeling more anxiety and having shakes. He says this started around the time he decided to start a gluten-free diet. He notes he has not felt any different on the gluten-free diet. Fortunately his shortness of breath has resolved since treating his anxiety. But he still complains of feeling foggy headed. He says it feels like a tightness and a band across his for head but not an actual head ache. It is at times he feels lightheaded almost like his head at the [employer]. He says sometimes has difficulties with his vision and mentally. He is very had a normal eye exam.   Review of Systems     Objective:   Physical Exam  Constitutional: He is oriented to person, place, and time. He appears well-developed and well-nourished.  HENT:  Head: Normocephalic and atraumatic.  Right Ear: External ear normal.  Left Ear: External ear normal.  Nose: Nose normal.  Mouth/Throat: Oropharynx is clear and moist.       TMs and canals are clear.   Eyes: Conjunctivae and EOM are normal. Pupils are equal, round, and reactive to light.  Neck: Neck supple. No thyromegaly present.  Cardiovascular: Normal rate and normal heart sounds.   Pulmonary/Chest: Effort normal and breath sounds normal.  Lymphadenopathy:    He has no cervical adenopathy.  Neurological: He is alert and oriented to person, place, and time.  Skin: Skin is warm and dry.  Psychiatric: He has a normal mood and affect.          Assessment & Plan:  Anxiety-he is doing fantastic. His GAD-7 score is 2 today. Continue celexa 20 mg and follow up in 4 months. I'm very happy to shortness of breath has resolved.  Foggy headedness-at this point in time I  have no good explanation for his symptoms. I was hoping that this would improve as we treat his anxiety. Unfortunately it has not, at this point in time I recommend referral to neurology for further evaluation.

## 2012-01-30 ENCOUNTER — Encounter: Payer: Self-pay | Admitting: Family Medicine

## 2012-01-30 DIAGNOSIS — G44209 Tension-type headache, unspecified, not intractable: Secondary | ICD-10-CM | POA: Insufficient documentation

## 2012-02-23 ENCOUNTER — Ambulatory Visit (INDEPENDENT_AMBULATORY_CARE_PROVIDER_SITE_OTHER): Payer: 59 | Admitting: Family Medicine

## 2012-02-23 ENCOUNTER — Encounter: Payer: Self-pay | Admitting: Family Medicine

## 2012-02-23 VITALS — BP 108/65 | HR 98 | Ht 70.0 in | Wt 162.0 lb

## 2012-02-23 DIAGNOSIS — G2581 Restless legs syndrome: Secondary | ICD-10-CM

## 2012-02-23 DIAGNOSIS — F411 Generalized anxiety disorder: Secondary | ICD-10-CM

## 2012-02-23 DIAGNOSIS — F419 Anxiety disorder, unspecified: Secondary | ICD-10-CM

## 2012-02-23 MED ORDER — ROPINIROLE HCL 0.25 MG PO TABS: 0.2500 mg | ORAL_TABLET | Freq: Every day | ORAL | Status: DC

## 2012-02-23 MED ORDER — CITALOPRAM HYDROBROMIDE 20 MG PO TABS: 20.0000 mg | ORAL_TABLET | Freq: Every day | ORAL | Status: DC

## 2012-02-23 NOTE — Progress Notes (Signed)
  Subjective:    Patient ID: Dale Hicks, male    DOB: 03-21-1976, 36 y.o.   MRN: [id number]  HPI  Followup depression and anxiety- Dong well on the citalorpam. No SE. HAs been on it for 8-9 months. Headaches and tension are much improved. He says he is interested in weaning off the medication and seeing how he does without it.  Still c/o of feeling restless.   RLS - Saw neurology. Tried horizant samples and tried it for aobut 2 weeks and says it really didn't work. We had tried requp in the past but can't remember if worked or not. Would like to try it again. He says that sometimes he even has daytime symptoms but his legs when he is sitting at his desk  Review of Systems     Objective:   Physical Exam  Constitutional: He appears well-developed and well-nourished.  HENT:  Head: Normocephalic and atraumatic.  Pulmonary/Chest: Effort normal and breath sounds normal.  Skin: Skin is warm and dry.  Psychiatric: He has a normal mood and affect. His behavior is normal.          Assessment & Plan:  Depression/anxiety-gas 7 score of 4 today. Well controlled. We discussed different options. He said he would like to wean off now since he has been well controlled for several months. He's been on for total of 8 months. We will order scription for 20 mg. He can take half a tab daily for 2 weeks then a half a tab every other day for 2 weeks and then stop. If he feels like he needs to restart the medication please give our office a call. Hopefully he will do well with this.  Restless leg syndrome-we had tried Requip 0.25 mg in the past. Sent Over the new prescription for 1-2 tabs daily. I encouraged him to start with one tablet before bedtime; if this is not helping he can go up to 2 or even 3 tablets on his own if needed. He is to call me in one month with an update and let me know if it is helping or not and what does he is using. Then we can get a new prescription for pharmacy.

## 2012-02-23 NOTE — Patient Instructions (Signed)
Cut tab in half. Take half a tab a day for 2 week, then 1/2 tab every other day for 2 weeks.  Then stop.  Call me in about a months with the restless leg, requip, and let me know how working.

## 2012-03-19 ENCOUNTER — Ambulatory Visit: Payer: 59 | Admitting: Family Medicine

## 2012-03-21 ENCOUNTER — Encounter: Payer: Self-pay | Admitting: *Deleted

## 2012-03-28 ENCOUNTER — Ambulatory Visit: Payer: 59 | Admitting: Family Medicine

## 2012-05-21 ENCOUNTER — Ambulatory Visit (INDEPENDENT_AMBULATORY_CARE_PROVIDER_SITE_OTHER): Payer: 59 | Admitting: Family Medicine

## 2012-05-21 ENCOUNTER — Encounter: Payer: Self-pay | Admitting: Family Medicine

## 2012-05-21 VITALS — BP 103/65 | HR 70 | Temp 98.1°F | Ht 70.0 in | Wt 158.0 lb

## 2012-05-21 DIAGNOSIS — R55 Syncope and collapse: Secondary | ICD-10-CM

## 2012-05-21 DIAGNOSIS — R51 Headache: Secondary | ICD-10-CM

## 2012-05-21 NOTE — Progress Notes (Signed)
Subjective:    Patient ID: Dale Hicks, male    DOB: 01/03/1976, 36 y.o.   MRN: [id number]  HPI 2 days ago was sitting at work and suddenly couldn't see out of the peripheral vison in his right eye and then felt gradually like his vison was going darker in both eyes. Someone was talking to him.  Started going away on its own. Then felt some tingling in his knukles that spread to his whole hand.  That went away after a few min. Had a HA before it happened.  WAs a peircing HA on the left side of hea. He has had a headache eversince. Hard to sleep.  Today HA is not as bad but feels like a pressure. Has to squint.  Did eat yesterday.  No lightheadedness with standing up.  No nasal congestion or runny nose or fever.  Not on allergy med or decongestant. Tooks some tylenol - helped some.  No CP or SOB. Non smoker.  No cough.  No other rx meds.  Mom with hx of migarines. No weight changes. Had recent normal eye exam. No longer on anxiety meds. Had a similar episode about 7 years ago when he was a passenger in the car.     Review of Systems BP 103/65  Pulse 70  Temp 98.1 F (36.7 C) (Oral)  Ht 5\' 10"  (1.778 m)  Wt 158 lb (71.668 kg)  BMI 22.67 kg/m2  SpO2 100%    No Known Allergies  Past Medical History  Diagnosis Date  . RLS (restless legs syndrome)     hx of  . Pericarditis     Past Surgical History  Procedure Date  . Wisdom teeth removal     History   Social History  . Marital Status: Married    Spouse Name: Artist Pais    Number of Children: 1  . Years of Education: N/A   Occupational History  . IT Analyst     [employer]    Social History Main Topics  . Smoking status: Former Smoker -- 1.0 packs/day for 7 years    Types: Cigarettes    Quit date: 01/23/2010  . Smokeless tobacco: Current User    Types: Chew  . Alcohol Use: Yes  . Drug Use: No  . Sexually Active: Not on file   Other Topics Concern  . Not on file   Social History Narrative   Regular exercise: Yes      Family History  Problem Relation Age of Onset  . Heart attack      GF in his 13s  . Diabetes Cousin   . Hyperlipidemia Mother   . Hypertension Mother     Outpatient Encounter Prescriptions as of 05/21/2012  Medication Sig Dispense Refill  . DISCONTD: citalopram (CELEXA) 20 MG tablet Take 1 tablet (20 mg total) by mouth daily.  30 tablet  0  . DISCONTD: rOPINIRole (REQUIP) 0.25 MG tablet Take 1-2 tablets (0.25-0.5 mg total) by mouth at bedtime.  60 tablet  1          Objective:   Physical Exam  Constitutional: He is oriented to person, place, and time. He appears well-developed and well-nourished.  HENT:  Head: Normocephalic and atraumatic.  Right Ear: External ear normal.  Left Ear: External ear normal.  Nose: Nose normal.  Mouth/Throat: Oropharynx is clear and moist.       TMs and canals are clear.   Eyes: Conjunctivae and EOM are normal. Pupils are equal,  round, and reactive to light.  Neck: Neck supple. No thyromegaly present.  Cardiovascular: Normal rate, regular rhythm and normal heart sounds.   Pulmonary/Chest: Effort normal and breath sounds normal.  Abdominal: Soft. Bowel sounds are normal. He exhibits no distension and no mass. There is no tenderness. There is no rebound and no guarding.  Musculoskeletal: He exhibits no edema.       Neck with NROM. UE and LE with NROM and strength 5/5. Reflexes 2+ in the UE and LE.   Alert and oriented.  CN 2-12 intact.  Neg rhomberg. Normal rapid alternating movements of hands. Reflexes symmetric in the UE and LE.  Normal knee to ankle, down the shin bilaterally.  No tremor.  Normal finger to nose.    Lymphadenopathy:    He has no cervical adenopathy.  Neurological: He is alert and oriented to person, place, and time.  Skin: Skin is warm and dry.  Psychiatric: He has a normal mood and affect. His behavior is normal.          Assessment & Plan:  Near syncope - consider Migraine vs hypoglycemia vs thyroid problem vs  vision problems vs sinusitis vs anemia vs anxiety.  He does have family history of migraines and he is in his early 30s so this could be the beginning of migraines. Sinusitis is a little less likely because he has not had any nasal congestion or fever or other sinus type symptoms. He did eat for hypoglycemia is also less likely but we will check a fasting glucose of his blood work. We'll check a TSH to evaluate for thyroid disorder. His eye exam is actually up to date so this is also less likely but still possible. We will check a CBC to rule out anemia. He did not have any chest pain, palpitations or shortness of breath with the episode but we will check an EKG today. An arrhythmia certainly possible but less likely. Also consider this could be related to anxiety as he does have a history of depression with anxiety and came off his medication several months ago. If his EKG and blood work is completely normal then consider further evaluation with head CT or MRI. EKG shows rate of 60 beats per minute, normal sinus rhythm, normal axis, no acute changes.

## 2012-05-21 NOTE — Patient Instructions (Addendum)
We will call you with the lab results Can use Tylenol for your headache.

## 2012-05-22 ENCOUNTER — Other Ambulatory Visit: Payer: Self-pay | Admitting: Family Medicine

## 2012-05-22 DIAGNOSIS — R519 Headache, unspecified: Secondary | ICD-10-CM

## 2012-05-22 DIAGNOSIS — H539 Unspecified visual disturbance: Secondary | ICD-10-CM

## 2012-05-22 DIAGNOSIS — R55 Syncope and collapse: Secondary | ICD-10-CM

## 2012-05-22 LAB — SEDIMENTATION RATE: Sed Rate: 1 mm/hr (ref 0–16)

## 2012-05-22 LAB — CBC WITH DIFFERENTIAL/PLATELET
Basophils Absolute: 0 10*3/uL (ref 0.0–0.1)
Basophils Relative: 1 % (ref 0–1)
Eosinophils Absolute: 0.2 10*3/uL (ref 0.0–0.7)
Eosinophils Relative: 4 % (ref 0–5)
Lymphocytes Relative: 29 % (ref 12–46)
MCH: 29.7 pg (ref 26.0–34.0)
MCHC: 34.7 g/dL (ref 30.0–36.0)
MCV: 85.6 fL (ref 78.0–100.0)
Monocytes Absolute: 0.6 10*3/uL (ref 0.1–1.0)
Platelets: 250 10*3/uL (ref 150–400)
RDW: 13.2 % (ref 11.5–15.5)
WBC: 5.9 10*3/uL (ref 4.0–10.5)

## 2012-05-22 LAB — COMPLETE METABOLIC PANEL WITH GFR
ALT: 28 U/L (ref 0–53)
AST: 25 U/L (ref 0–37)
Alkaline Phosphatase: 69 U/L (ref 39–117)
BUN: 16 mg/dL (ref 6–23)
Creat: 0.85 mg/dL (ref 0.50–1.35)
Total Bilirubin: 0.4 mg/dL (ref 0.3–1.2)

## 2012-05-22 LAB — TSH: TSH: 2.343 u[IU]/mL (ref 0.350–4.500)

## 2012-05-23 ENCOUNTER — Encounter: Payer: Self-pay | Admitting: *Deleted

## 2012-05-23 ENCOUNTER — Telehealth: Payer: Self-pay | Admitting: *Deleted

## 2012-05-23 DIAGNOSIS — R51 Headache: Secondary | ICD-10-CM

## 2012-05-23 DIAGNOSIS — R55 Syncope and collapse: Secondary | ICD-10-CM

## 2012-05-23 DIAGNOSIS — H539 Unspecified visual disturbance: Secondary | ICD-10-CM

## 2012-05-23 NOTE — Telephone Encounter (Signed)
CT Head changed to MRI brain per insurance recommendation with Dr. Shelah Lewandowsky approval.

## 2012-05-28 ENCOUNTER — Ambulatory Visit (HOSPITAL_BASED_OUTPATIENT_CLINIC_OR_DEPARTMENT_OTHER)
Admission: RE | Admit: 2012-05-28 | Discharge: 2012-05-28 | Disposition: A | Discharge status: Home / Self Care | Payer: 59 | Source: Ambulatory Visit | Attending: Family Medicine | Admitting: Family Medicine

## 2012-05-28 DIAGNOSIS — H539 Unspecified visual disturbance: Secondary | ICD-10-CM | POA: Insufficient documentation

## 2012-05-28 DIAGNOSIS — R42 Dizziness and giddiness: Secondary | ICD-10-CM | POA: Insufficient documentation

## 2012-05-28 DIAGNOSIS — R55 Syncope and collapse: Secondary | ICD-10-CM | POA: Insufficient documentation

## 2012-05-28 DIAGNOSIS — R51 Headache: Secondary | ICD-10-CM | POA: Insufficient documentation

## 2012-05-29 ENCOUNTER — Other Ambulatory Visit: Payer: Self-pay | Admitting: Family Medicine

## 2012-05-29 MED ORDER — CITALOPRAM HYDROBROMIDE 20 MG PO TABS: 20.0000 mg | ORAL_TABLET | Freq: Every day | ORAL | Status: DC

## 2012-10-14 ENCOUNTER — Other Ambulatory Visit: Payer: Self-pay | Admitting: *Deleted

## 2012-10-14 MED ORDER — CITALOPRAM HYDROBROMIDE 20 MG PO TABS: 20.0000 mg | ORAL_TABLET | Freq: Every day | ORAL | Status: DC

## 2012-10-28 ENCOUNTER — Ambulatory Visit (INDEPENDENT_AMBULATORY_CARE_PROVIDER_SITE_OTHER): Payer: 59 | Admitting: Family Medicine

## 2012-10-28 ENCOUNTER — Encounter: Payer: Self-pay | Admitting: Family Medicine

## 2012-10-28 VITALS — BP 101/59 | HR 72 | Resp 18 | Wt 160.0 lb

## 2012-10-28 DIAGNOSIS — F341 Dysthymic disorder: Secondary | ICD-10-CM

## 2012-10-28 DIAGNOSIS — F418 Other specified anxiety disorders: Secondary | ICD-10-CM

## 2012-10-28 MED ORDER — SERTRALINE HCL 50 MG PO TABS: ORAL_TABLET | ORAL | Status: DC

## 2012-10-28 NOTE — Progress Notes (Signed)
  Subjective:    Patient ID: Dale Hicks, male    DOB: [birth year], 38 y.o.   MRN: [id number]  HPI Followup depression and anxiety. Says only taking 10mg  of citalopraim.  Says not sleeping well for one month. Says when tried ot go to 20mg  it cuased more panic attacks. He denies any acute stressors over the holidays. Has never been on other mood medications.  Has felt more foggy headed.  Has more HA over his eyes.  Has had normal eye exam and normal MRI.   Review of Systems     Objective:   Physical Exam  Constitutional: He is oriented to person, place, and time. He appears well-developed and well-nourished.  HENT:  Head: Normocephalic and atraumatic.  Cardiovascular: Normal rate, regular rhythm and normal heart sounds.   Pulmonary/Chest: Effort normal and breath sounds normal.  Neurological: He is alert and oriented to person, place, and time.  Skin: Skin is warm and dry.  Psychiatric: He has a normal mood and affect. His behavior is normal.          Assessment & Plan:  PHQ 9 score of 11 today. Gad 7 score of 8.Will wean citalopram and start sertraline. I think this actually might cover his anxiety a little better. I want to change his medications for couple of reasons today one is that I don't think she's completely therapeutic. #2 says anxiety actually got a little but worse on the increase citalopram dose. Thus I would just like to change him. I will followup in 5 weeks. Call if any S.E or symptoms or not tolerating the new medication well.

## 2012-10-28 NOTE — Patient Instructions (Signed)
Cut in half. Take half a tab daily for one week, then half every other day.   In one week start the sertraline, follow the instructions.

## 2012-11-04 ENCOUNTER — Other Ambulatory Visit: Payer: Self-pay | Admitting: *Deleted

## 2012-11-04 MED ORDER — SERTRALINE HCL 50 MG PO TABS: ORAL_TABLET | ORAL | Status: DC

## 2012-11-25 ENCOUNTER — Ambulatory Visit (INDEPENDENT_AMBULATORY_CARE_PROVIDER_SITE_OTHER): Payer: 59 | Admitting: Physician Assistant

## 2012-11-25 ENCOUNTER — Encounter: Payer: Self-pay | Admitting: Physician Assistant

## 2012-11-25 VITALS — BP 113/68 | HR 75 | Ht 70.0 in | Wt 162.0 lb

## 2012-11-25 DIAGNOSIS — R0602 Shortness of breath: Secondary | ICD-10-CM

## 2012-11-25 DIAGNOSIS — G44209 Tension-type headache, unspecified, not intractable: Secondary | ICD-10-CM

## 2012-11-25 MED ORDER — AMITRIPTYLINE HCL 25 MG PO TABS: ORAL_TABLET | ORAL | Status: DC

## 2012-11-25 MED ORDER — ALBUTEROL SULFATE (2.5 MG/3ML) 0.083% IN NEBU: 2.5000 mg | INHALATION_SOLUTION | Freq: Once | RESPIRATORY_TRACT | Status: DC

## 2012-11-25 MED ORDER — ALBUTEROL SULFATE HFA 108 (90 BASE) MCG/ACT IN AERS: 2.0000 | INHALATION_SPRAY | Freq: Four times a day (QID) | RESPIRATORY_TRACT | Status: DC | PRN

## 2012-11-25 MED ORDER — AZITHROMYCIN 250 MG PO TABS: ORAL_TABLET | ORAL | Status: DC

## 2012-11-25 NOTE — Patient Instructions (Addendum)
Stop prednisone. Start using albuterol twice a day or up to every 6 hours as needed for cough or shortness of breath. Zpak to use if production or cough gets worse.   Elavil to start for headache prevention. Follow up in 1 month.

## 2012-11-25 NOTE — Progress Notes (Signed)
  Subjective:    Patient ID: Dale Hicks, male    DOB: [birth year], 37 y.o.   MRN: [id number]  HPI Patient is a 37 yo male who presents to the clinic with SOB and chest tightness. He is concerned that it was caused from zoloft and prednisone together. He has just started Zoloft for about a week and then on the 30th of January started prednisone given from another family medicine provider that was given for headaches. When he started prednisone the same day he began to have shortness of breath and his lungs "felt tight". This is his first time taking prednisone. He denies any fever, chills, muscle pain, sinus pressure, CP, ear pain.He has had a dry cough. He has felt very weak and dizzy almost like he is going to pass out but has not. He has continued to take both meds but yesterday only took 1 of the 4 tabs. He still is having SOB. No history of asthma or bronchitis.  He did feel like headache was some better after being on prednisone for 2 days. HA's are ongoing and daily. Describes as pressure behind eyes and around head quality changes daily. He has had MRI of head and was normal.ibuprofen does not touch headache. Does not know of any triggers. Nothing really makes better.eye exam was recent and normal. Sometimes sensitive to light but does not noticed it as a trigger. Most of the time he wakes up with them.     Review of Systems     Objective:   Physical Exam  Constitutional: He is oriented to person, place, and time. He appears well-developed and well-nourished.  HENT:  Head: Normocephalic and atraumatic.  Right Ear: External ear normal.  Left Ear: External ear normal.  Nose: Nose normal.  Mouth/Throat: Oropharynx is clear and moist. No oropharyngeal exudate.       TM's clear bilaterally. Negative for maxillary or frontal sinus tenderness.  Eyes: Conjunctivae normal are normal.  Neck: Normal range of motion. Neck supple.  Cardiovascular: Normal rate, regular rhythm and normal heart  sounds.   Pulmonary/Chest: Effort normal and breath sounds normal. He has no wheezes.  Lymphadenopathy:    He has no cervical adenopathy.  Neurological: He is alert and oriented to person, place, and time.  Skin: Skin is warm and dry.  Psychiatric: He has a normal mood and affect. His behavior is normal.          Assessment & Plan:  SOB/chest tightness-Peak flow 425. Neb given. Reassured patient that I did not feel like came from medications. Continue Zoloft but stop prednisone. Since pt did feel better after neb I gave him albuterol inhaler to use as needed up to every 6 hours. Concerned it may turn into more. Printed zpak rx to take if not improving in the next 4 days.   Tension Headaches- Did give elavil to use as night to see if helps with headaches. I see in previous notes where he has been referred to neurology but never went. I think we need to consider sending to neurologist or Headache specialist for further evaluation of headaches. Pt wants to see if elavil helps at all and then consider neurology referral.

## 2012-12-10 ENCOUNTER — Other Ambulatory Visit: Payer: Self-pay | Admitting: *Deleted

## 2012-12-10 MED ORDER — SERTRALINE HCL 50 MG PO TABS: ORAL_TABLET | ORAL | Status: DC

## 2012-12-30 ENCOUNTER — Encounter: Payer: Self-pay | Admitting: Family Medicine

## 2012-12-30 ENCOUNTER — Ambulatory Visit (INDEPENDENT_AMBULATORY_CARE_PROVIDER_SITE_OTHER): Payer: 59 | Admitting: Family Medicine

## 2012-12-30 VITALS — BP 100/58 | HR 81 | Ht 70.0 in | Wt 172.0 lb

## 2012-12-30 DIAGNOSIS — F341 Dysthymic disorder: Secondary | ICD-10-CM

## 2012-12-30 DIAGNOSIS — F418 Other specified anxiety disorders: Secondary | ICD-10-CM

## 2012-12-30 DIAGNOSIS — G44209 Tension-type headache, unspecified, not intractable: Secondary | ICD-10-CM

## 2012-12-30 MED ORDER — SERTRALINE HCL 100 MG PO TABS: 100.0000 mg | ORAL_TABLET | Freq: Every day | ORAL | Status: DC

## 2012-12-30 MED ORDER — AMITRIPTYLINE HCL 50 MG PO TABS: 50.0000 mg | ORAL_TABLET | Freq: Every day | ORAL | Status: DC

## 2012-12-30 NOTE — Progress Notes (Signed)
  Subjective:    Patient ID: Dale Hicks, male    DOB: [birth year], 37 y.o.   MRN: [id number]  HPI Here to depression/anxiety. Tolerating the sertraline well without any side effects or problems. He has noticed an increase in his appetite. He has gained 10 pounds in the last month. He admits he's not been working out and has been eating more than usual. He says he is not sure if it's from the medication or not but did want to bring up today. He feels his anxiety is under very good control still is struggling with a couple of depression symptoms. Including little interest or pleasure in doing things and feeling tired and having low energy. Sleep has been okay.  HA - he saw one of my partners about 4 weeks ago for daily tension-type headaches. He was started on amitriptyline. He's been tolerating it well without any side effects. Says the elavil has been helping.  No snoring.  No hx of sleep apnea. Says when misses med will have a HA the next day.    Review of Systems     Objective:   Physical Exam  Constitutional: He is oriented to person, place, and time. He appears well-developed and well-nourished.  HENT:  Head: Normocephalic and atraumatic.  Cardiovascular: Normal rate, regular rhythm and normal heart sounds.   Pulmonary/Chest: Effort normal and breath sounds normal.  Neurological: He is alert and oriented to person, place, and time.  Skin: Skin is warm and dry.  Psychiatric: He has a normal mood and affect. His behavior is normal.          Assessment & Plan:  Depression /anxiety - PHQ 9 score of 9, GAD-7 score of 2. Overall he is make great progress. I'm happy with his current regimen. We will increase her joints 100 mg to see if we can get the depressive symptoms a little bit more in control. He has noticed an increase in his appetite.  Tension headache-the amitriptyline seems to be working very well. He's now taking 50 mg. Description sent to pharmacy. Followup in 8 weeks. I  explained to him that this is not necessarily a long-term medication. The goal keep would be to keep him on it for about 6 months his headaches under control and if he is doing well at that point then we can work on weaning it down.

## 2013-02-16 IMAGING — US US ABDOMEN COMPLETE
1 series · 14 of 25 positions shown · non-contrast
Comparison: None

CLINICAL DATA: Elevated LFTs.

COMPLETE ABDOMINAL ULTRASOUND

[Series 1: us abdomen complete · 0.24mm/px · 14 of 77 slices shown]
[im 1/77]
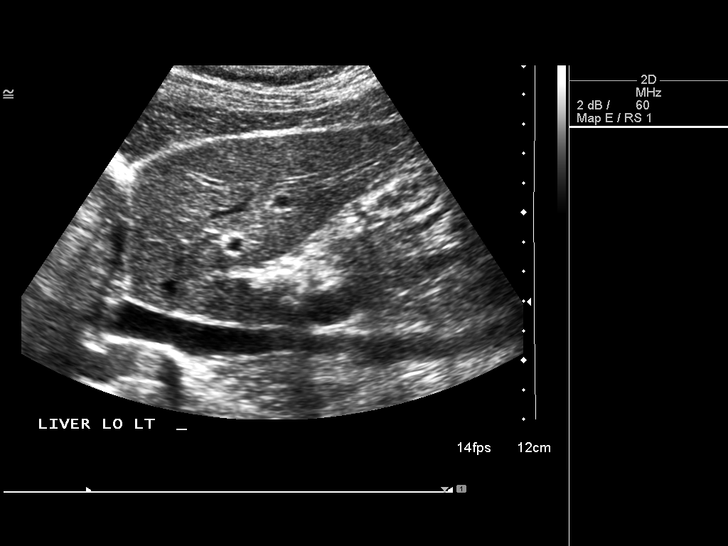
[im 7/77]
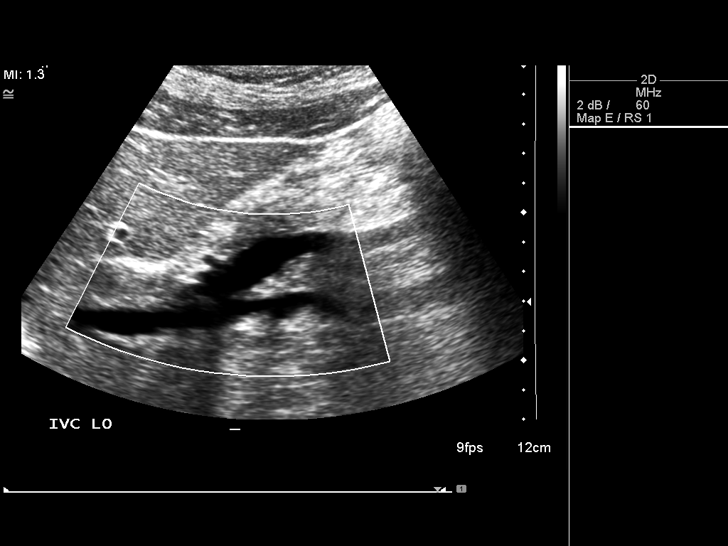
[im 13/77]
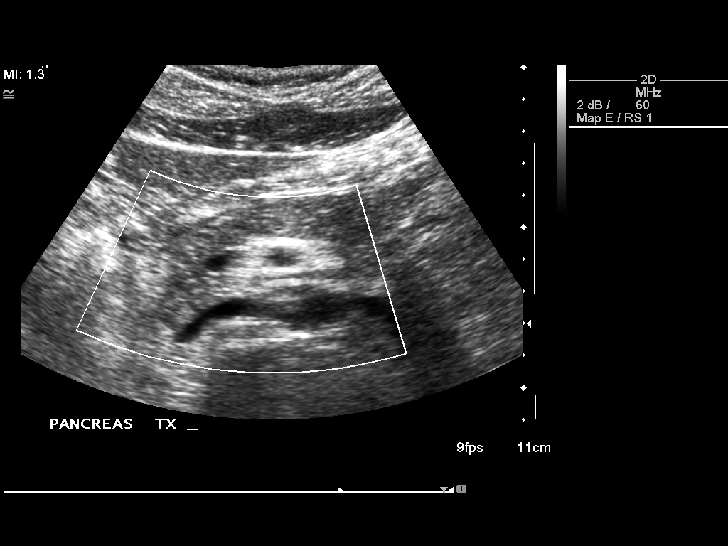
[im 20/77]
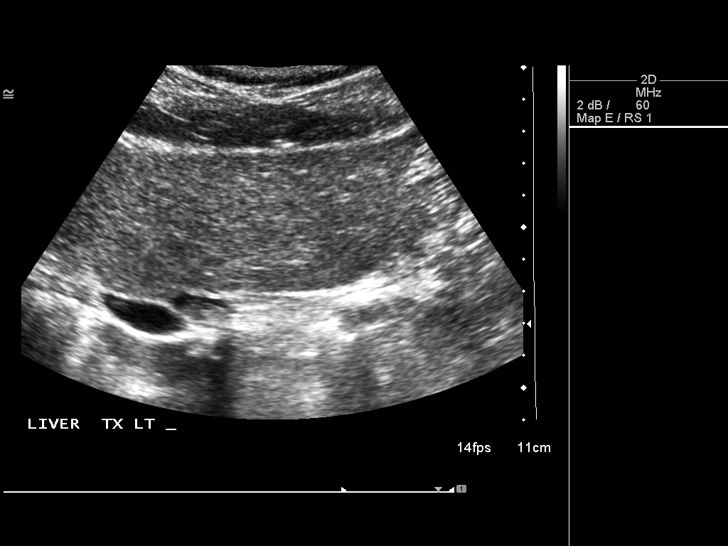
[im 26/77]
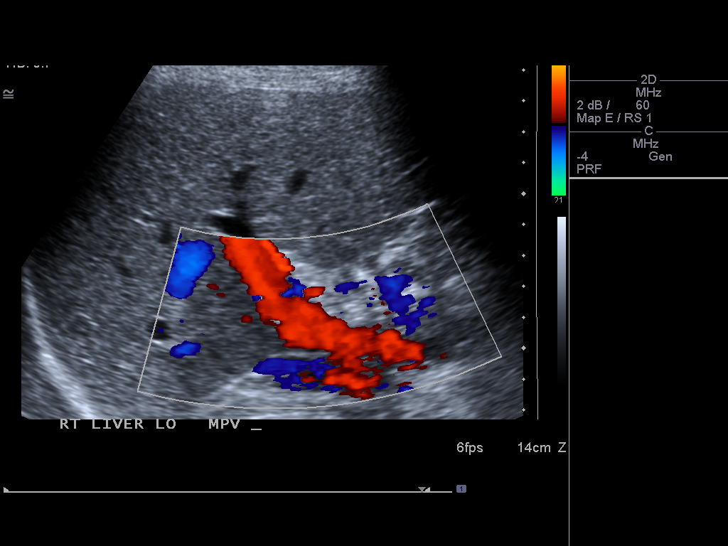
[im 29/77]
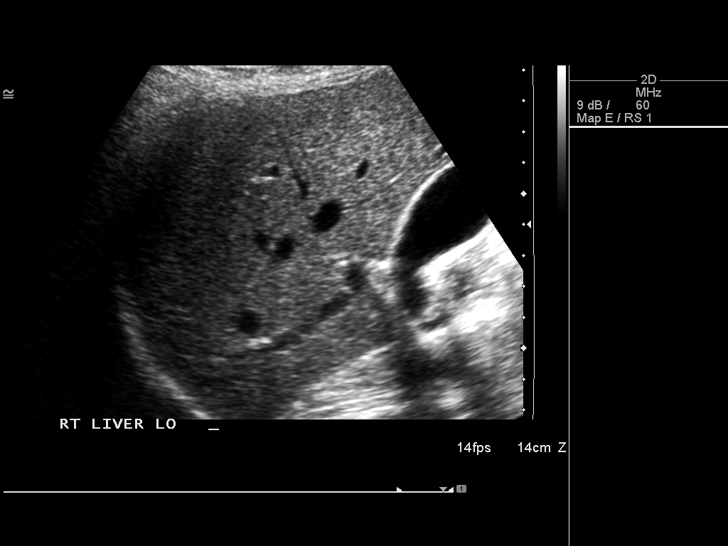
[im 35/77]
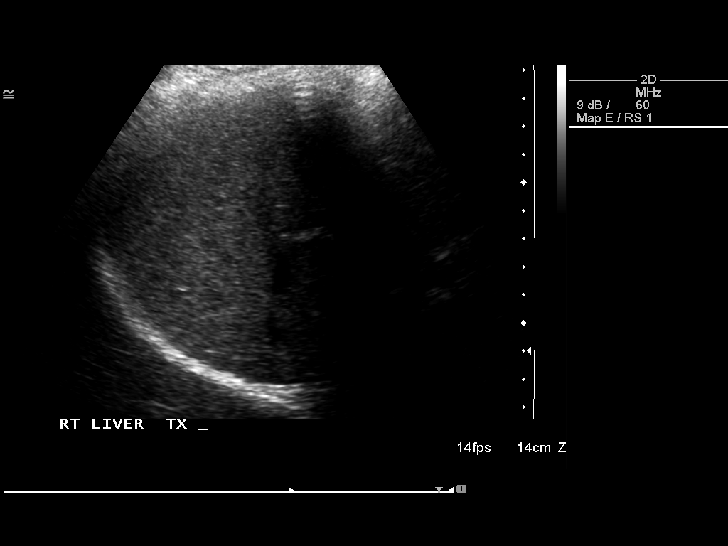
[im 42/77]
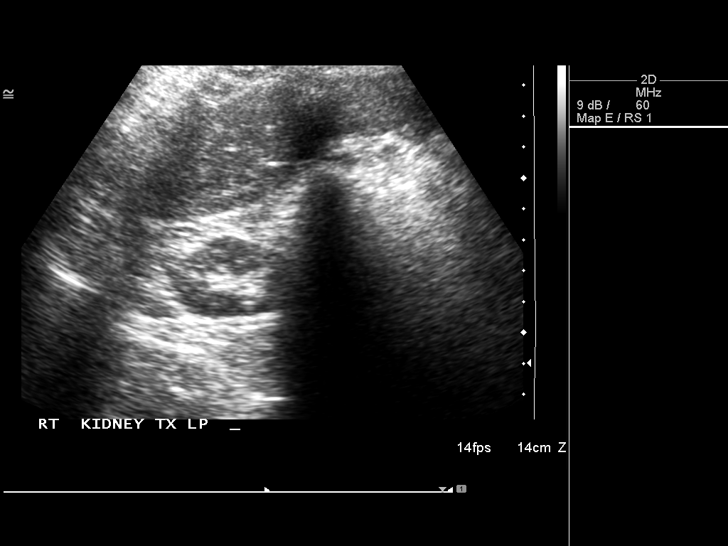
[im 48/77]
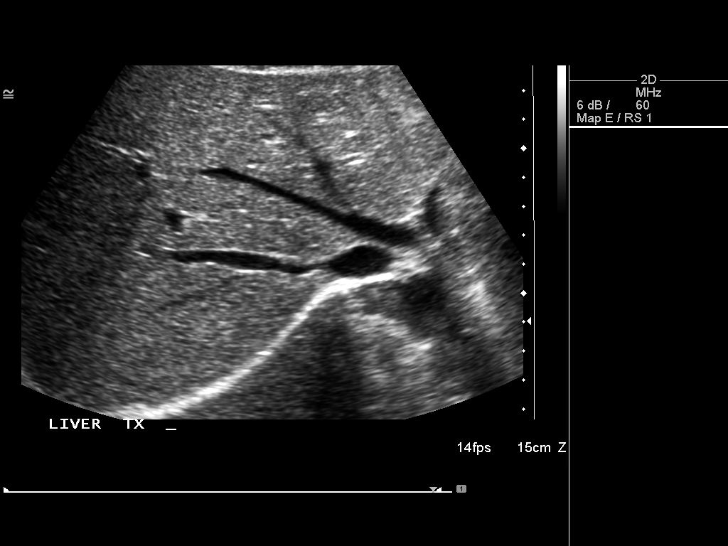
[im 51/77]
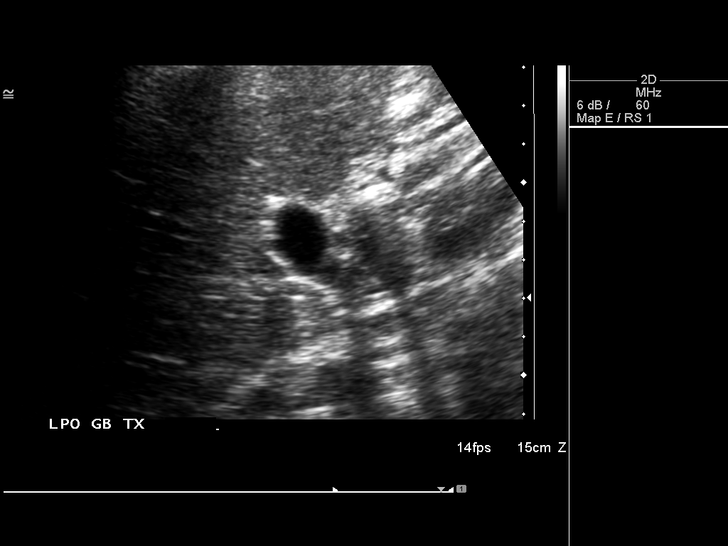
[im 58/77]
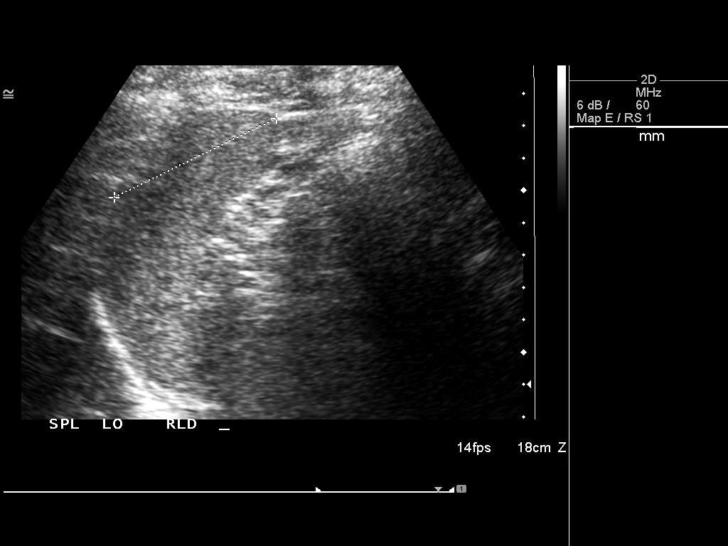
[im 64/77]
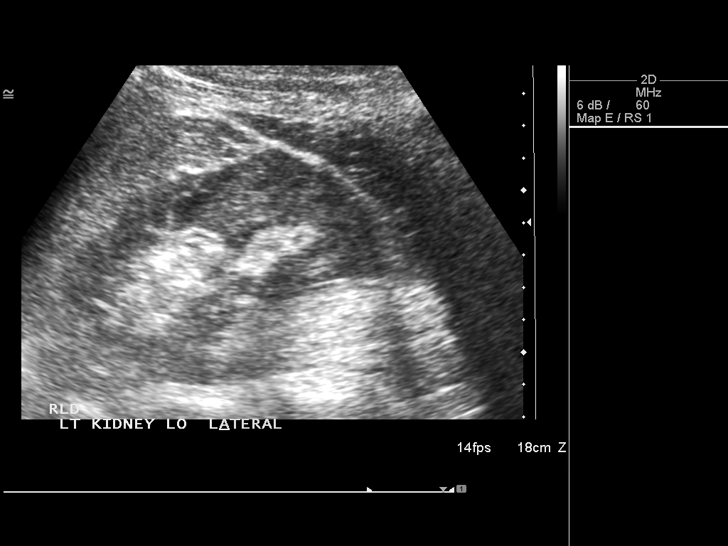
[im 70/77]
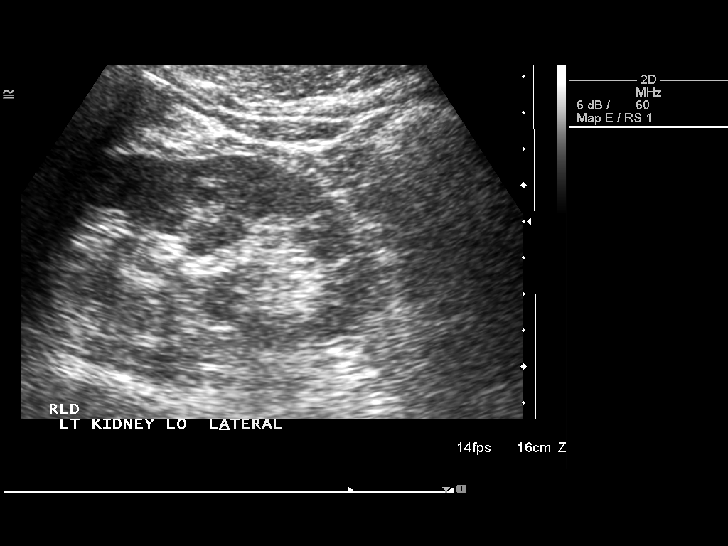
[im 77/77]
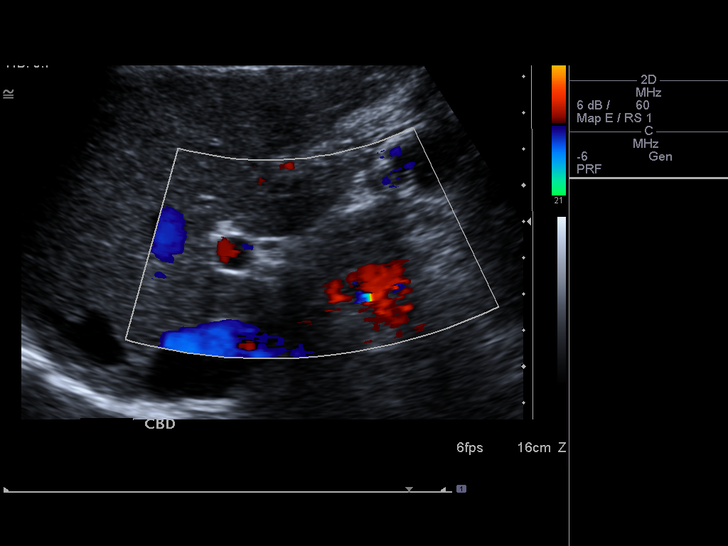

[14 of 25 positions shown; findings below may reference images not displayed]

FINDINGS: Gallbladder:  No gallstones, gallbladder wall thickening, or
pericholecystic fluid.

Common bile duct:   Normal caliber, 3 mm.

Liver:  No focal lesion identified.  Within normal limits in
parenchymal echogenicity.

IVC:  Appears normal.

Pancreas:  No focal abnormality seen.

Spleen:  Within normal limits in size and echotexture.

Right Kidney:   Normal in size and parenchymal echogenicity.  No
evidence of mass or hydronephrosis.

Left Kidney:  Normal in size and parenchymal echogenicity.  No
evidence of mass or hydronephrosis.

Abdominal aorta:  No aneurysm identified.
IMPRESSION: Negative abdominal ultrasound.

## 2013-02-27 ENCOUNTER — Telehealth: Payer: Self-pay | Admitting: *Deleted

## 2013-02-27 NOTE — Telephone Encounter (Signed)
Left detailed message on vm informing pt 

## 2013-02-27 NOTE — Telephone Encounter (Signed)
Cut in half for 5 days then then stop. Can make a f/u if we need to find something to replace this med

## 2013-02-27 NOTE — Telephone Encounter (Signed)
Pt calls today is wanting to stop his elavil.  He said he has gained around 25 lbs since starting it.  Is there a taper down for this drug? Please advise.

## 2013-04-03 ENCOUNTER — Encounter: Payer: Self-pay | Admitting: Family Medicine

## 2013-04-03 ENCOUNTER — Ambulatory Visit (INDEPENDENT_AMBULATORY_CARE_PROVIDER_SITE_OTHER): Payer: 59 | Admitting: Family Medicine

## 2013-04-03 VITALS — BP 102/58 | HR 66 | Wt 178.0 lb

## 2013-04-03 DIAGNOSIS — F341 Dysthymic disorder: Secondary | ICD-10-CM

## 2013-04-03 DIAGNOSIS — G4700 Insomnia, unspecified: Secondary | ICD-10-CM

## 2013-04-03 DIAGNOSIS — F418 Other specified anxiety disorders: Secondary | ICD-10-CM

## 2013-04-03 DIAGNOSIS — G44209 Tension-type headache, unspecified, not intractable: Secondary | ICD-10-CM

## 2013-04-03 MED ORDER — SERTRALINE HCL 100 MG PO TABS: 100.0000 mg | ORAL_TABLET | Freq: Every day | ORAL | Status: DC

## 2013-04-03 NOTE — Patient Instructions (Signed)

## 2013-04-03 NOTE — Progress Notes (Signed)
  Subjective:    Patient ID: Dale Hicks, male    DOB: [birth year], 37 y.o.   MRN: [id number]  HPI F/u depression and anxiety - Says doing well on the sertraline. No side effects on the medication. He is tolerating it well and does feel that it's very helpful. He feels it's time. Good job in helping to control his anxiety and has helped his depression fairly well. Says still feels really tired all the time.    Insomnia - Says doesn't sleep well.  Says thinks the HA are overall better but still happen.  Did stop the elavil bc of rapid weight gain. Has daytime sleepiness.  If moves head reapidely feels dizzy. Goes to bed 11PM and wakes up at 6:30 AM.  Takes about 45 min to fall asleep.  Will usu wake up around 12-1. Son wakes him up as well during the nighttime.  Most caffeine is in AM only.  Never tried any OTC sleep aids.  Not snoring nightly.   Tension headache-he says overall they have increased a little since discontinuing amitriptyline but the much better than they were before he actually started the amitriptyline. He says the headaches are no longer daily which has been great.  Review of Systems     Objective:   Physical Exam  Constitutional: He is oriented to person, place, and time. He appears well-developed and well-nourished.  HENT:  Head: Normocephalic and atraumatic.  Cardiovascular: Normal rate, regular rhythm and normal heart sounds.   Pulmonary/Chest: Effort normal and breath sounds normal.  Neurological: He is alert and oriented to person, place, and time.  Skin: Skin is warm and dry.  Psychiatric: He has a normal mood and affect. His behavior is normal.          Assessment & Plan:  Depression /Anxiety - doing well on sertraline.  PHQ-9 score of 8 and gad 7 score of 5. Control is still not optimized but so far he is happy with the results so we will continue the current regimen for now. Followup in 2-3 months. See if we can improve his sleep quality that will improve both  his anxiety and depression scores.  Insomnia-uncontrolled. New diagnosis. we had a long discussion about different treatment options. He can certainly start with something over-the-counter such as Benadryl or melatonin. Both of these should be safe with his Zoloft. It is not helpful then we could consider a prescription sleep aid such as trazodone or possibly Ambien, Lunesta, or Sonata. We discussed the potential risks and addictive nature of these last 3 medications. He has not had any of the over-the-counter medication so I will like him to start with either the melatonin or the Benadryl and get both a try. I also warned about potential side effects with those particular medications. If they are not helpful then please call the office back.  Tension headache-he says overall they have increased a little since discontinuing amitriptyline but the much better than they were before he actually started the amitriptyline. I think getting a stress under better control has helped. He still thinks is poor quality sleep could be contributing as well and I definitely think it is worthwhile addressing that as a separate issue and really working on improving his sleep quality.

## 2013-04-14 ENCOUNTER — Telehealth: Payer: Self-pay | Admitting: *Deleted

## 2013-04-14 MED ORDER — SERTRALINE HCL 100 MG PO TABS: 100.0000 mg | ORAL_TABLET | Freq: Every day | ORAL | Status: DC

## 2013-04-14 NOTE — Telephone Encounter (Signed)
Request [facility] day [facility]. Barry Dienes, LPN

## 2013-06-03 ENCOUNTER — Ambulatory Visit (INDEPENDENT_AMBULATORY_CARE_PROVIDER_SITE_OTHER): Payer: 59 | Admitting: Family Medicine

## 2013-06-03 ENCOUNTER — Encounter: Payer: Self-pay | Admitting: Family Medicine

## 2013-06-03 VITALS — BP 107/62 | HR 63 | Ht 70.0 in | Wt 180.0 lb

## 2013-06-03 DIAGNOSIS — F32A Depression, unspecified: Secondary | ICD-10-CM

## 2013-06-03 DIAGNOSIS — F329 Major depressive disorder, single episode, unspecified: Secondary | ICD-10-CM

## 2013-06-03 DIAGNOSIS — Z23 Encounter for immunization: Secondary | ICD-10-CM

## 2013-06-03 DIAGNOSIS — F341 Dysthymic disorder: Secondary | ICD-10-CM

## 2013-06-03 MED ORDER — SERTRALINE HCL 100 MG PO TABS: 150.0000 mg | ORAL_TABLET | Freq: Every day | ORAL | Status: DC

## 2013-06-03 NOTE — Progress Notes (Signed)
  Subjective:    Patient ID: Dale Hicks, male    DOB: 29-Jun-1976, 37 y.o.   MRN: [id number]  HPI followup depression and anxiety.  He still complains of feeling little interest or pleasure in doing things as well as feeling fatigued. No thoughts of hurting himself. Also still feels very restless and has trouble relaxing.  Insomnia-has been a little bit better with the melatonin.   Review of Systems     Objective:   Physical Exam  Constitutional: He is oriented to person, place, and time. He appears well-developed and well-nourished.  HENT:  Head: Normocephalic and atraumatic.  Cardiovascular: Normal rate, regular rhythm and normal heart sounds.   Pulmonary/Chest: Effort normal and breath sounds normal.  Neurological: He is alert and oriented to person, place, and time.  Skin: Skin is warm and dry.  Psychiatric: He has a normal mood and affect. His behavior is normal.          Assessment & Plan:  Depression and anxiety-PHQ 9 score of 9. Gad 7 score of 7. F/u in 3 months. Call if needs new rx since increasing dose. If doesn't feel well then can go back down to 100mg .

## 2013-06-03 NOTE — Addendum Note (Signed)
Addended by: Deno Etienne on: 06/03/2013 01:06 PM   Modules accepted: Orders, SmartSet

## 2013-08-28 ENCOUNTER — Other Ambulatory Visit: Payer: Self-pay

## 2013-08-28 ENCOUNTER — Encounter: Payer: Self-pay | Admitting: Family Medicine

## 2013-08-28 ENCOUNTER — Ambulatory Visit (INDEPENDENT_AMBULATORY_CARE_PROVIDER_SITE_OTHER): Payer: 59 | Admitting: Family Medicine

## 2013-08-28 VITALS — BP 98/62 | HR 64 | Temp 98.3°F | Wt 181.0 lb

## 2013-08-28 DIAGNOSIS — R5381 Other malaise: Secondary | ICD-10-CM

## 2013-08-28 DIAGNOSIS — F341 Dysthymic disorder: Secondary | ICD-10-CM

## 2013-08-28 DIAGNOSIS — F418 Other specified anxiety disorders: Secondary | ICD-10-CM

## 2013-08-28 DIAGNOSIS — R42 Dizziness and giddiness: Secondary | ICD-10-CM

## 2013-08-28 NOTE — Progress Notes (Signed)
Subjective:    Patient ID: Dale Hicks, male    DOB: May 12, 1976, 37 y.o.   MRN: [id number]  HPI Depression/Anxiety - We increased the zoloft to 150mg .  Sleep is fiar. Hard time staying alseep.  Wakes up feeling tired. No snoring. Does occ have panic attacks.   He is extremely exhausted and having frequent HA. It is just hard to get going during the day. He feels off balance. He feels he is leaning to the side occasionally he denies any positional triggers. He denies any head injury or trauma. He denies any nausea or chest pain or sweats with the sensation..  Feels almost like a head rush.  Has been going on for year.  Feels had some sxs before starting medications. Last eye exam was a year ago.  visoin has been off as well.  No ear pain or pressure. No hx of vertigo.  No CP or diaphoresis. Does notice it when gets up in the AM. Has to sit on edge of bed for a few minutrs.  He says he does not snore. He has difficulty falling asleep and sometimes staying asleep. No ear pain or pressure.   Review of Systems  BP 98/62  Pulse 64  Temp(Src) 98.3 F (36.8 C) (Oral)  Wt 181 lb (82.101 kg)    Allergies  Allergen Reactions  . Amitriptyline Other (See Comments)    Rapid weight gain.     Past Medical History  Diagnosis Date  . RLS (restless legs syndrome)     hx of  . Pericarditis     Past Surgical History  Procedure Laterality Date  . Wisdom teeth removal      History   Social History  . Marital Status: Married    Spouse Name: Artist Pais    Number of Children: 1  . Years of Education: N/A   Occupational History  . IT Analyst     [employer]    Social History Main Topics  . Smoking status: Former Smoker -- 1.00 packs/day for 7 years    Types: Cigarettes    Quit date: 01/23/2010  . Smokeless tobacco: Current User    Types: Chew  . Alcohol Use: Yes  . Drug Use: No  . Sexual Activity: Not on file   Other Topics Concern  . Not on file   Social History Narrative   Regular exercise: Yes    Family History  Problem Relation Age of Onset  . Heart attack      GF in his 43s  . Diabetes Cousin   . Hyperlipidemia Mother   . Hypertension Mother     Outpatient Encounter Prescriptions as of 08/28/2013  Medication Sig  . MELATONIN PO Take 1 tablet by mouth at bedtime as needed.  . sertraline (ZOLOFT) 100 MG tablet Take 1.5 tablets (150 mg total) by mouth daily. 1 tab daily          Objective:   Physical Exam  Constitutional: He is oriented to person, place, and time. He appears well-developed and well-nourished.  HENT:  Head: Normocephalic and atraumatic.  Right Ear: External ear normal.  Left Ear: External ear normal.  Nose: Nose normal.  Mouth/Throat: Oropharynx is clear and moist.  TMs and canals are clear.   Eyes: Conjunctivae and EOM are normal. Pupils are equal, round, and reactive to light.  Neck: Neck supple. No thyromegaly present.  Cardiovascular: Normal rate and normal heart sounds.   Pulmonary/Chest: Effort normal and breath sounds normal.  Lymphadenopathy:    He has no cervical adenopathy.  Neurological: He is alert and oriented to person, place, and time.  Negative Dix-Hallpike maneuver  Skin: Skin is warm and dry.  Psychiatric: He has a normal mood and affect.          Assessment & Plan:  Depression/anxiety-gad 7 score of today which is down from 7. He's had a great response on the anxiety component. PHQ 9 score of 8 which is down from 9. He had 3 points for fatigue and 2 points for sleep issues and two points for appetite. Overall I feel he's had a good response to the 150 mg and would like to continue that dose. If he starts to feel excellent his symptoms are related to the medication we could certainly consider weaning it off.  Dizziness - vision was normal today. Orthostatics were normal as well. Dix-Hallpike maneuver was negative for benign positional vertigo.  Check TSH for thyroid disease as well as a CBC for anemia.  We'll also check electrolytes. Also consider this could be coming from the sertraline or certainly could be aggravating his symptoms especially since he feels that he started having symptoms before he started the sertraline. Still encouraged him to get a full eye exam as soon as possible. It all of these are normal then consider referral to neurology for further evaluation.  Fatigue-check CBC and thyroid level. Will check for anemia.

## 2013-08-29 ENCOUNTER — Other Ambulatory Visit: Payer: Self-pay | Admitting: Family Medicine

## 2013-08-29 DIAGNOSIS — R42 Dizziness and giddiness: Secondary | ICD-10-CM

## 2013-08-29 DIAGNOSIS — R51 Headache: Secondary | ICD-10-CM

## 2013-08-29 DIAGNOSIS — R748 Abnormal levels of other serum enzymes: Secondary | ICD-10-CM

## 2013-08-29 LAB — CBC WITH DIFFERENTIAL/PLATELET
Lymphocytes Relative: 32 % (ref 12–46)
Lymphs Abs: 2.1 10*3/uL (ref 0.7–4.0)
MCV: 84.6 fL (ref 78.0–100.0)
Neutrophils Relative %: 51 % (ref 43–77)
Platelets: 255 10*3/uL (ref 150–400)
RBC: 4.74 MIL/uL (ref 4.22–5.81)
WBC: 6.6 10*3/uL (ref 4.0–10.5)

## 2013-08-29 LAB — COMPLETE METABOLIC PANEL WITH GFR
AST: 33 U/L (ref 0–37)
BUN: 12 mg/dL (ref 6–23)
Calcium: 9.4 mg/dL (ref 8.4–10.5)
Chloride: 102 mEq/L (ref 96–112)
Creat: 0.84 mg/dL (ref 0.50–1.35)
GFR, Est African American: >89 mL/min

## 2013-08-29 LAB — FERRITIN: Ferritin: 146 ng/mL (ref 22–322)

## 2013-08-29 LAB — TSH: TSH: 1.942 u[IU]/mL (ref 0.350–4.500)

## 2013-09-03 ENCOUNTER — Ambulatory Visit: Payer: 59 | Admitting: Family Medicine

## 2013-10-21 ENCOUNTER — Other Ambulatory Visit: Payer: Self-pay | Admitting: *Deleted

## 2013-10-21 MED ORDER — SERTRALINE HCL 100 MG PO TABS: 150.0000 mg | ORAL_TABLET | Freq: Every day | ORAL | Status: DC

## 2013-10-24 ENCOUNTER — Encounter: Payer: Self-pay | Admitting: Family Medicine

## 2013-10-24 ENCOUNTER — Ambulatory Visit (INDEPENDENT_AMBULATORY_CARE_PROVIDER_SITE_OTHER): Payer: 59 | Admitting: Family Medicine

## 2013-10-24 VITALS — BP 113/76 | HR 70 | Temp 97.7°F | Ht 70.0 in | Wt 183.0 lb

## 2013-10-24 DIAGNOSIS — Z13220 Encounter for screening for lipoid disorders: Secondary | ICD-10-CM

## 2013-10-24 DIAGNOSIS — R5383 Other fatigue: Secondary | ICD-10-CM

## 2013-10-24 DIAGNOSIS — R748 Abnormal levels of other serum enzymes: Secondary | ICD-10-CM

## 2013-10-24 DIAGNOSIS — R5381 Other malaise: Secondary | ICD-10-CM

## 2013-10-24 DIAGNOSIS — R42 Dizziness and giddiness: Secondary | ICD-10-CM

## 2013-10-24 MED ORDER — SERTRALINE HCL 100 MG PO TABS: 150.0000 mg | ORAL_TABLET | Freq: Every day | ORAL | Status: DC

## 2013-10-24 NOTE — Progress Notes (Signed)
   Subjective:    Patient ID: Dale HarnessJason R Hicks, male    DOB: 06/29/1976, 38 y.o.   MRN: [id number]  HPI Feels off balanced and fatigued. Feels "groggy" during the daytime. Says feels like leaning to the side.   Sudden movements make him dizzy and off balance. Only getting about 6 hour of sleep at night as has a little one at home.  No ear pain or recent URI.  No numbness but his legs feel shakey when his walks down stair.  No head injury or trauma. No CP or visoin changes. No ear ringing.  Says not worse on the sertraline. Has been going on for months.    Review of Systems     Objective:   Physical Exam  Constitutional: He is oriented to person, place, and time. He appears well-developed and well-nourished.  HENT:  Head: Normocephalic and atraumatic.  Cardiovascular: Normal rate, regular rhythm and normal heart sounds.   Pulmonary/Chest: Effort normal and breath sounds normal.  Neurological: He is alert and oriented to person, place, and time. He displays normal reflexes. No cranial nerve deficit. He exhibits normal muscle tone. Coordination normal.  Neg Diks Hallpike manuver  Skin: Skin is warm and dry.  Psychiatric: He has a normal mood and affect. His behavior is normal.          Assessment & Plan:  Vertigo/off balance - Will refer to neurology for further evaluation. Unclear to me what exactly is causing his symptoms. He does not have any abnormal ear exam the fluid on the ears. He has a negative Dix-Hallpike maneuver. His orthostatics are completely normal. He is fairly and has no risk factors for stroke. He does have a history of tension headaches but these symptoms are not necessarily associated with headache. He does not like the sertraline has made his symptoms worse or aggravated them. And he doesn't like the sertraline is helpful for his mood. At this point I still have a clear cause for his symptoms and would like to refer him. We also did lab work last time to rule out anemia,  thyroid problems, look like disturbances et Karie Sodacetera and this was all fairly normal. He did have mildly elevated liver enzymes.  Fatigue - work on getting  More rest and sleep  Labs were normal.    Elevated liver enzymes- Recheck today and will screen wilson's disease.

## 2013-10-28 LAB — HEPATIC FUNCTION PANEL
ALT: 42 U/L (ref 0–53)
AST: 35 U/L (ref 0–37)
Albumin: 4.6 g/dL (ref 3.5–5.2)
Alkaline Phosphatase: 90 U/L (ref 39–117)
BILIRUBIN TOTAL: 0.6 mg/dL (ref 0.3–1.2)
Bilirubin, Direct: 0.1 mg/dL (ref 0.0–0.3)
Indirect Bilirubin: 0.5 mg/dL (ref 0.0–0.9)
Total Protein: 7.1 g/dL (ref 6.0–8.3)

## 2013-10-28 LAB — LIPID PANEL
CHOL/HDL RATIO: 5.8 ratio
CHOLESTEROL: 181 mg/dL (ref 0–200)
HDL: 31 mg/dL — AB (ref >39)
LDL Cholesterol: 89 mg/dL (ref 0–99)
TRIGLYCERIDES: 306 mg/dL — AB (ref <150)
VLDL: 61 mg/dL — ABNORMAL HIGH (ref 0–40)

## 2013-11-03 LAB — COPPER, URINE, 24 HOUR
Copper,Urine (24 Hr): 9 mcg/L (ref 2–30)
Creatinine, Urine mg/day-CUURI: 1.6 g/(24.h) (ref 0.63–2.50)
Total Volume - CURRI: 1100 mL

## 2013-11-12 ENCOUNTER — Ambulatory Visit: Payer: 59 | Admitting: Family Medicine

## 2014-03-25 ENCOUNTER — Other Ambulatory Visit: Payer: Self-pay | Admitting: Family Medicine

## 2014-05-11 ENCOUNTER — Encounter: Payer: Self-pay | Admitting: Family Medicine

## 2014-05-21 ENCOUNTER — Ambulatory Visit (INDEPENDENT_AMBULATORY_CARE_PROVIDER_SITE_OTHER): Payer: 59 | Admitting: Family Medicine

## 2014-05-21 ENCOUNTER — Encounter: Payer: Self-pay | Admitting: Family Medicine

## 2014-05-21 VITALS — BP 96/59 | HR 68 | Wt 184.0 lb

## 2014-05-21 DIAGNOSIS — F341 Dysthymic disorder: Secondary | ICD-10-CM

## 2014-05-21 DIAGNOSIS — F418 Other specified anxiety disorders: Secondary | ICD-10-CM

## 2014-05-21 NOTE — Progress Notes (Signed)
   Subjective:    Patient ID: Dale HarnessJason R Cohill, male    DOB: 12/19/1975, 38 y.o.   MRN: [id number]  HPI Here for followup depression with anxiety-he is interested in stopping his Zoloft at this point time. Sleeping well. Sill has occ day of feeling down, but not often.   Abnormal liver function-there were elevated previously but on last check that he go back down to normal. He denies any family history of liver problems. We will keep close file mass. Review of Systems     Objective:   Physical Exam  Constitutional: He is oriented to person, place, and time. He appears well-developed and well-nourished.  HENT:  Head: Normocephalic and atraumatic.  Eyes: EOM are normal.  Pulmonary/Chest: Effort normal.  Neurological: He is alert and oriented to person, place, and time. He has normal reflexes.  Skin: Skin is warm and dry.  Psychiatric: He has a normal mood and affect. His behavior is normal. Judgment and thought content normal.          Assessment & Plan:  Depression with anxiety - gad 7 score of 2 today and PHQ 9 score of 4. Overall he feels like he is well controlled. He's been on medication from his 18 month and would like to discontinue it. I gave a written taper. He should be off within the next month. If he has any problems please call back and let me know. We can always taper more slowly if needed if he starts to experience any symptoms or side effects of discontinuing the medication.  Encouraged him to schedule a physical in January as we will need to recheck liver enzymes at that time because of abnormal liver function.

## 2014-05-21 NOTE — Patient Instructions (Signed)
Cut the zoloft in half.  Take half tab daily for 2 weeks, then 1/2 tab every other day for 2 weeks and then can stop.

## 2014-08-07 ENCOUNTER — Other Ambulatory Visit: Payer: Self-pay

## 2014-08-20 ENCOUNTER — Ambulatory Visit: Payer: 59 | Admitting: Family Medicine

## 2014-08-27 ENCOUNTER — Ambulatory Visit: Payer: 59 | Admitting: Family Medicine

## 2015-04-28 ENCOUNTER — Telehealth: Payer: Self-pay | Admitting: *Deleted

## 2015-04-28 ENCOUNTER — Encounter: Payer: Self-pay | Admitting: *Deleted

## 2015-04-28 NOTE — Telephone Encounter (Signed)
Pre-Visit Call completed with patient and chart updated.   Pre-Visit Info documented in Specialty Comments under [employer].    

## 2015-04-29 ENCOUNTER — Ambulatory Visit (INDEPENDENT_AMBULATORY_CARE_PROVIDER_SITE_OTHER): Payer: 59 | Admitting: Medical

## 2015-04-29 ENCOUNTER — Encounter: Payer: Self-pay | Admitting: Medical

## 2015-04-29 VITALS — BP 120/72 | HR 79 | Temp 99.0°F | Ht <= 58 in | Wt 174.8 lb

## 2015-04-29 DIAGNOSIS — H539 Unspecified visual disturbance: Secondary | ICD-10-CM

## 2015-04-29 DIAGNOSIS — R202 Paresthesia of skin: Secondary | ICD-10-CM

## 2015-04-29 DIAGNOSIS — R5383 Other fatigue: Secondary | ICD-10-CM | POA: Diagnosis not present

## 2015-04-29 DIAGNOSIS — M6281 Muscle weakness (generalized): Secondary | ICD-10-CM | POA: Diagnosis not present

## 2015-04-29 NOTE — Assessment & Plan Note (Signed)
New to our office. Last labs done in January. Will get cbc, cmp, tsh, and lipid panel today.

## 2015-04-29 NOTE — Progress Notes (Signed)
Subjective:    Patient ID: Dale Hicks, male    DOB: 10/27/1975, 39 y.o.   MRN: [id number]  HPI   I have reviewed pt PMH, PSH, FH, Social History and Surgical History.  Pt states overall his he is stating not anxious and not depressed. Pt states he has not been on sertraline for over a year. Pt did take the sertraline for about a year before he stopped. Never noticed a difference.  Fatigue- Pt states for a long time was feeling fatigue. Work up that he had was negative. So know cause was found. Pt was placed on depression med after scored poorly screening test.  Pt also mentions that Sunday had transient vision change. Center of his visual field was blurry. He states had this for 15 minutes. Then he had another event same about a month ago. This past Sunday he did have a ha. At that time his left foot felt mild transient tingly. No urinary incontinence. Every since that happened he has felt week. Feels mild wobbly/weak chroncially.  No recent tick bites.  Pt states also remote acute event when he lost vision 10 years ago. Lasted for couple of minutes. He can't remember details of that event.  Pt states one year dilated eye exam was normal.  Pt works Air cabin crewbusiness analyst. Exericise 2-3 times a week, 1 cup coffee a day, Diet is ok, married- 2 child.   Review of Systems  Constitutional: Negative for fever, chills, appetite change and fatigue.  HENT: Negative for congestion.   Eyes: Positive for visual disturbance.       Sunday none now.  Respiratory: Negative for cough, chest tightness, shortness of breath and wheezing.   Cardiovascular: Negative for chest pain and palpitations.  Endocrine: Negative for polydipsia, polyphagia and polyuria.  Genitourinary: Negative for dysuria, hematuria and flank pain.  Musculoskeletal: Negative for back pain.       Mild general weakness.  Skin: Negative for rash.  Neurological: Positive for headaches. Negative for dizziness, seizures, syncope,  weakness, light-headedness and numbness.       Only on Sunday past. Mid level. Was temporal.  Transient left foot paresthesia     Past Medical History  Diagnosis Date  . RLS (restless legs syndrome)     hx of  . Pericarditis   . Anxiety   . Depression     History   Social History  . Marital Status: Married    Spouse Name: Artist PaisShawna  . Number of Children: 1  . Years of Education: N/A   Occupational History  . IT Analyst     [employer]    Social History Main Topics  . Smoking status: Former Smoker -- 1.00 packs/day for 7 years    Types: Cigarettes    Quit date: 01/23/2010  . Smokeless tobacco: Current User    Types: Chew  . Alcohol Use: Yes  . Drug Use: No  . Sexual Activity: Not on file   Other Topics Concern  . Not on file   Social History Narrative   Regular exercise: Yes    Past Surgical History  Procedure Laterality Date  . Wisdom teeth removal    . Vasectomy      10/23/14    Family History  Problem Relation Age of Onset  . Heart attack      GF in his 3050s  . Diabetes Cousin   . Hyperlipidemia Mother   . Hypertension Mother     Allergies  Allergen Reactions  .  Amitriptyline Other (See Comments)    Rapid weight gain.     Current Outpatient Prescriptions on File Prior to Visit  Medication Sig Dispense Refill  . acetaminophen (TYLENOL) 325 MG tablet Take 650 mg by mouth every 6 (six) hours as needed.    Marland Kitchen MELATONIN PO Take 1 tablet by mouth at bedtime as needed.    . sertraline (ZOLOFT) 100 MG tablet TAKE 1 TABLET DAILY (Patient not taking: Reported on 04/28/2015) 90 tablet 2   No current facility-administered medications on file prior to visit.    BP 120/72 mmHg  Pulse 79  Temp(Src) 99 F (37.2 C) (Oral)  Ht  (1.473 m)  Wt 174 lb 12.8 oz (79.289 kg)  BMI 36.54 kg/m2  SpO2 98%      Objective:   Physical Exam  General Mental Status- Alert. General Appearance- Not in acute distress.   Skin General: Color- Normal Color.  Moisture- Normal Moisture.  Neck Carotid Arteries- Normal color. Moisture- Normal Moisture. No carotid bruits. No JVD.  Chest and Lung Exam Auscultation: Breath Sounds:-Normal.  Cardiovascular Auscultation:Rythm- Regular. Murmurs & Other Heart Sounds:Auscultation of the heart reveals- No Murmurs.  Abdomen Inspection:-Inspeection Normal. Palpation/Percussion:Note:No mass. Palpation and Percussion of the abdomen reveal- Non Tender, Non Distended + BS, no rebound or guarding.    Neurologic Cranial Nerve exam:- CN III-XII intact(No nystagmus), symmetric smile. Drift Test:- No drift. Romberg Exam:- Negative.  Heal to Toe Gait exam:-Normal. Finger to Nose:- Normal/Intact Strength:- 5/5 equal and symmetric strength both upper and lower extremities. Sharp and dull discrimination intact.     Assessment & Plan:

## 2015-04-29 NOTE — Assessment & Plan Note (Addendum)
Paresthesia oOf left leg transient on Sunday. Pt had ha at that time with some blurred vision.  But also complete bilateral loss of vison 10 yrs ago that came back after 2 minutes or so. Rare HA overall  With his complaint of generlized weakness the majority of the time, I do think neurologic evaluation indicated.(may need MRI of head)  If pt has any recurrent episodes before neurologist evaluation then ED evaluation.

## 2015-04-29 NOTE — Patient Instructions (Addendum)
Fatigue New to our office. Last labs done in January. Will get cbc, cmp, tsh, and lipid panel today.  Paresthesia Paresthesia oOf left leg transient on Sunday. Pt had ha at that time with some blurred vision.  But also complete bilateral loss of vison 10 yrs ago that came back after 2 minutes or so. Rare HA overall  With his complaint of generlized weakness the majority of the time, I do think neurologic evaluation indicated.(may need MRI of head)  If pt has any recurrent episodes before neurologist evaluation then ED evaluation.    Follow up in 3 wks or as needed

## 2015-04-29 NOTE — Progress Notes (Signed)
Pre visit review using our clinic review tool, if applicable. No additional management support is needed unless otherwise documented below in the visit note. 

## 2015-04-30 LAB — COMPREHENSIVE METABOLIC PANEL
ALT: 29 U/L (ref 0–53)
AST: 25 U/L (ref 0–37)
Albumin: 4.8 g/dL (ref 3.5–5.2)
Alkaline Phosphatase: 68 U/L (ref 39–117)
BILIRUBIN TOTAL: 0.7 mg/dL (ref 0.2–1.2)
BUN: 15 mg/dL (ref 6–23)
CALCIUM: 9.8 mg/dL (ref 8.4–10.5)
CHLORIDE: 99 meq/L (ref 96–112)
CO2: 31 mEq/L (ref 19–32)
Creatinine, Ser: 1.01 mg/dL (ref 0.40–1.50)
GFR: 88.34 mL/min (ref >60.00)
Glucose, Bld: 78 mg/dL (ref 70–99)
Potassium: 4 mEq/L (ref 3.5–5.1)
SODIUM: 139 meq/L (ref 135–145)
TOTAL PROTEIN: 7.4 g/dL (ref 6.0–8.3)

## 2015-04-30 LAB — CBC WITH DIFFERENTIAL/PLATELET
BASOS PCT: 0.6 % (ref 0.0–3.0)
Basophils Absolute: 0.1 10*3/uL (ref 0.0–0.1)
Eosinophils Absolute: 0.2 10*3/uL (ref 0.0–0.7)
Eosinophils Relative: 1.7 % (ref 0.0–5.0)
HEMATOCRIT: 43.6 % (ref 39.0–52.0)
HEMOGLOBIN: 14.8 g/dL (ref 13.0–17.0)
LYMPHS ABS: 2.3 10*3/uL (ref 0.7–4.0)
LYMPHS PCT: 24.8 % (ref 12.0–46.0)
MCHC: 34 g/dL (ref 30.0–36.0)
MCV: 86.1 fl (ref 78.0–100.0)
Monocytes Absolute: 0.7 10*3/uL (ref 0.1–1.0)
Monocytes Relative: 7.1 % (ref 3.0–12.0)
NEUTROS ABS: 6.1 10*3/uL (ref 1.4–7.7)
Neutrophils Relative %: 65.8 % (ref 43.0–77.0)
Platelets: 286 10*3/uL (ref 150.0–400.0)
RBC: 5.07 Mil/uL (ref 4.22–5.81)
RDW: 12.6 % (ref 11.5–15.5)
WBC: 9.3 10*3/uL (ref 4.0–10.5)

## 2015-04-30 LAB — TSH: TSH: 1.87 u[IU]/mL (ref 0.35–4.50)

## 2015-05-03 ENCOUNTER — Ambulatory Visit: Payer: Self-pay | Admitting: Family Medicine

## 2015-05-20 ENCOUNTER — Telehealth: Payer: Self-pay | Admitting: Medical

## 2015-05-20 ENCOUNTER — Encounter: Payer: Self-pay | Admitting: Medical

## 2015-05-20 ENCOUNTER — Ambulatory Visit (INDEPENDENT_AMBULATORY_CARE_PROVIDER_SITE_OTHER): Payer: 59 | Admitting: Medical

## 2015-05-20 VITALS — BP 115/68 | HR 79 | Temp 98.7°F | Ht 70.0 in | Wt 175.8 lb

## 2015-05-20 DIAGNOSIS — R202 Paresthesia of skin: Secondary | ICD-10-CM

## 2015-05-20 DIAGNOSIS — H547 Unspecified visual loss: Secondary | ICD-10-CM | POA: Diagnosis not present

## 2015-05-20 DIAGNOSIS — R5383 Other fatigue: Secondary | ICD-10-CM | POA: Diagnosis not present

## 2015-05-20 DIAGNOSIS — E785 Hyperlipidemia, unspecified: Secondary | ICD-10-CM

## 2015-05-20 HISTORY — DX: Hyperlipidemia, unspecified: E78.5

## 2015-05-20 LAB — LIPID PANEL
CHOLESTEROL: 168 mg/dL (ref 0–200)
HDL: 34.3 mg/dL — ABNORMAL LOW (ref >39.00)
NONHDL: 133.99
Total CHOL/HDL Ratio: 5
Triglycerides: 209 mg/dL — ABNORMAL HIGH (ref 0.0–149.0)
VLDL: 41.8 mg/dL — AB (ref 0.0–40.0)

## 2015-05-20 LAB — LDL CHOLESTEROL, DIRECT: LDL DIRECT: 86 mg/dL

## 2015-05-20 MED ORDER — FENOFIBRATE 48 MG PO TABS: 48.0000 mg | ORAL_TABLET | Freq: Every day | ORAL | Status: DC

## 2015-05-20 NOTE — Progress Notes (Signed)
Pre visit review using our clinic review tool, if applicable. No additional management support is needed unless otherwise documented below in the visit note. 

## 2015-05-20 NOTE — Progress Notes (Signed)
Subjective:    Patient ID: Dale Hicks, male    DOB: [birth year], 39 y.o.   MRN: [id number]  HPI Here for lipid panel. In January triglycerides high and hdl low. Pt is fasting today. Pt is eating better. Low fat and low cholesterol diet. Pt was exercising 2-3 times cardio mostly. Occasional weights. Pt bp is controlled today.  Pt labs were negative 3 months ago. Pt has hx of depression.Pt used to use sertraline in the past. Stopped that one year. Some fatigue when he took that one year ago. Pt states wakes up fair bit with his 2 yr and 37 yr old. Pt does not snore. Sleeps 6-7 hours a night. Pt is going to see neurologist.   Review of Systems  Constitutional: Positive for fatigue. Negative for fever, chills, diaphoresis and activity change.  Respiratory: Negative for cough, chest tightness and shortness of breath.   Cardiovascular: Negative for chest pain, palpitations and leg swelling.  Gastrointestinal: Negative for nausea, vomiting and abdominal pain.  Musculoskeletal: Negative for neck pain and neck stiffness.  Neurological: Negative for dizziness, tremors, seizures, syncope, facial asymmetry, speech difficulty, weakness, light-headedness, numbness and headaches.  Psychiatric/Behavioral: Negative for behavioral problems, confusion and agitation. The patient is not nervous/anxious.     Past Medical History  Diagnosis Date  . RLS (restless legs syndrome)     hx of  . Pericarditis   . Anxiety   . Depression     History   Social History  . Marital Status: Married    Spouse Name: Artist Pais  . Number of Children: 1  . Years of Education: N/A   Occupational History  . IT Analyst     [employer]    Social History Main Topics  . Smoking status: Former Smoker -- 1.00 packs/day for 7 years    Types: Cigarettes    Quit date: 01/23/2010  . Smokeless tobacco: Current User    Types: Chew  . Alcohol Use: Yes  . Drug Use: No  . Sexual Activity: Not on file   Other Topics  Concern  . Not on file   Social History Narrative   Regular exercise: Yes    Past Surgical History  Procedure Laterality Date  . Wisdom teeth removal    . Vasectomy      10/23/14    Family History  Problem Relation Age of Onset  . Heart attack      GF in his 91s  . Diabetes Cousin   . Hyperlipidemia Mother   . Hypertension Mother     Allergies  Allergen Reactions  . Amitriptyline Other (See Comments)    Rapid weight gain.     Current Outpatient Prescriptions on File Prior to Visit  Medication Sig Dispense Refill  . acetaminophen (TYLENOL) 325 MG tablet Take 650 mg by mouth every 6 (six) hours as needed.    Marland Kitchen MELATONIN PO Take 1 tablet by mouth at bedtime as needed.     No current facility-administered medications on file prior to visit.    BP 115/68 mmHg  Pulse 79  Temp(Src) 98.7 F (37.1 C) (Oral)  Ht 5\' 10"  (1.778 m)  Wt 175 lb 12.8 oz (79.742 kg)  BMI 25.22 kg/m2  SpO2 100%      Objective:   Physical Exam  General Mental Status- Alert. General Appearance- Not in acute distress.   Skin General: Color- Normal Color. Moisture- Normal Moisture.  Neck Carotid Arteries- Normal color. Moisture- Normal Moisture. No carotid bruits.  Chest and Lung Exam Auscultation: Breath Sounds:-Normal. CTA.  Cardiovascular Auscultation:Rythm- Regular. Murmurs & Other Heart Sounds:Auscultation of the heart reveals- No Murmurs.    Neurologic Cranial Nerve exam:- CN III-XII intact(No nystagmus), symmetric smile. Strength:- 5/5 equal and symmetric strength both upper and lower extremities.      Assessment & Plan:

## 2015-05-20 NOTE — Assessment & Plan Note (Signed)
Work up pending. Will see neuro and get mri as well.

## 2015-05-20 NOTE — Assessment & Plan Note (Signed)
Repeat labs today. Continue diet and exercise.

## 2015-05-20 NOTE — Patient Instructions (Addendum)
Hyperlipidemia Repeat labs today. Continue diet and exercise.  Paresthesia Work up pending. Will see neuro and get mri as well.  Fatigue Labs cmp, tsh and cbc negative. Consider if neuro does not find cause of parestesia that depression low level may be effecting energy. May try trial of effexor.    Follow up in 4 weeks or as needed  Note no vision changes today but dx put in based hx of and order mri.

## 2015-05-20 NOTE — Assessment & Plan Note (Signed)
Labs cmp, tsh and cbc negative. Consider if neuro does not find cause of parestesia that depression low level may be effecting energy. May try trial of effexor.

## 2015-05-20 NOTE — Telephone Encounter (Signed)
rx fenofibrate 

## 2015-06-01 ENCOUNTER — Encounter: Payer: Self-pay | Admitting: Neurology

## 2015-06-01 ENCOUNTER — Ambulatory Visit (INDEPENDENT_AMBULATORY_CARE_PROVIDER_SITE_OTHER): Payer: 59 | Admitting: Neurology

## 2015-06-01 VITALS — BP 110/80 | HR 74 | Resp 20 | Ht 70.0 in | Wt 156.9 lb

## 2015-06-01 DIAGNOSIS — R5383 Other fatigue: Secondary | ICD-10-CM

## 2015-06-01 DIAGNOSIS — R42 Dizziness and giddiness: Secondary | ICD-10-CM | POA: Diagnosis not present

## 2015-06-01 DIAGNOSIS — G43109 Migraine with aura, not intractable, without status migrainosus: Secondary | ICD-10-CM | POA: Insufficient documentation

## 2015-06-01 NOTE — Progress Notes (Signed)
NEUROLOGY CONSULTATION NOTE  Dale Hicks MRN: [id number] DOB: 1976/02/24  Referring provider: Esperanza Richters, PA-C Primary care provider: Esperanza Richters, PA-C  Reason for consult:  Episode of headache, visual disturbance and paresthesias  HISTORY OF PRESENT ILLNESS: Dale Hicks is a 39 year old right-handed male who presents for episode of paresthesias, visual disturbance and headache.  History obtained by patient and PCP note.  Labs and images of brain MRI from 2013 personally reviewed.  A couple of months ago, he was resting at home when he suddenly felt "shaky".  He then developed a greenish "blob" in his visual field.  He went to lay down and took a nap.  When he woke up, the visual disturbance resolved but he had a dull non-throbbing bi-parietal headache, as well as burning and tingling sensation involving the left foot and left hand.  The headache lasted the rest of the day.  The paresthesias lasted into the next day.  He denied nausea, photophobia, phonophobia or focal weakness.  He reports a similar episode about 10 years ago.  He was driving when suddenly he developed a larger green scotoma covering most of his visual field.  It lasted about 5 minutes and resolved.  He had a mild headache afterwards.  He also has chronic fatigue and feeling of lightheadedness.  He denies spinning sensation.  He has had work up for this in 2013.  MRI of the brain without contrast from 05/28/12 was normal.  Labs, including CBC, CMP, TSH and Sed Rate, were normal.  Labs from last month were normal, including CBC with diff, CMP and TSH of 1.87.    He denies prior history of headache.  His mother has migraines.  His maternal grandmother had stroke.   PAST MEDICAL HISTORY: Past Medical History  Diagnosis Date  . RLS (restless legs syndrome)     hx of  . Pericarditis   . Anxiety   . Depression   . Hyperlipidemia 05/20/2015    PAST SURGICAL HISTORY: Past Surgical History  Procedure Laterality  Date  . Wisdom teeth removal    . Vasectomy      10/23/14    MEDICATIONS: Current Outpatient Prescriptions on File Prior to Visit  Medication Sig Dispense Refill  . acetaminophen (TYLENOL) 325 MG tablet Take 650 mg by mouth every 6 (six) hours as needed.    . fenofibrate (TRICOR) 48 MG tablet Take 1 tablet (48 mg total) by mouth daily. 30 tablet 2  . MELATONIN PO Take 1 tablet by mouth at bedtime as needed.     No current facility-administered medications on file prior to visit.    ALLERGIES: Allergies  Allergen Reactions  . Amitriptyline Other (See Comments)    Rapid weight gain.     FAMILY HISTORY: Family History  Problem Relation Age of Onset  . Heart attack Paternal Grandfather     GF in his 58s  . Diabetes Cousin   . Hyperlipidemia Mother   . Hypertension Mother     SOCIAL HISTORY: History   Social History  . Marital Status: Married    Spouse Name: Artist Pais  . Number of Children: 1  . Years of Education: N/A   Occupational History  . IT Analyst     [employer]    Social History Main Topics  . Smoking status: Former Smoker -- 1.00 packs/day for 7 years    Types: Cigarettes    Quit date: 01/23/2010  . Smokeless tobacco: Current User  Types: Chew     Comment: patient is aware it is not healthy for him   . Alcohol Use: 0.0 oz/week    0 Standard drinks or equivalent per week  . Drug Use: No  . Sexual Activity:    Partners: Female   Other Topics Concern  . Not on file   Social History Narrative   Regular exercise: Yes    REVIEW OF SYSTEMS: Constitutional: No fevers, chills, or sweats, no generalized fatigue, change in appetite Eyes: No visual changes, double vision, eye pain Ear, nose and throat: No hearing loss, ear pain, nasal congestion, sore throat Cardiovascular: No chest pain, palpitations Respiratory:  No shortness of breath at rest or with exertion, wheezes GastrointestinaI: No nausea, vomiting, diarrhea, abdominal pain, fecal  incontinence Genitourinary:  No dysuria, urinary retention or frequency Musculoskeletal:  No neck pain, back pain Integumentary: No rash, pruritus, skin lesions Neurological: as above Psychiatric: No depression, insomnia, anxiety Endocrine: No palpitations, fatigue, diaphoresis, mood swings, change in appetite, change in weight, increased thirst Hematologic/Lymphatic:  No anemia, purpura, petechiae. Allergic/Immunologic: no itchy/runny eyes, nasal congestion, recent allergic reactions, rashes  PHYSICAL EXAM: Filed Vitals:   06/01/15 1303  BP: 110/80  Pulse: 74  Resp: 20   General: No acute distress.  Patient appears well-groomed.  normal body habitus. Head:  Normocephalic/atraumatic Eyes:  fundi unremarkable, without vessel changes, exudates, hemorrhages or papilledema. Neck: supple, no paraspinal tenderness, full range of motion Back: No paraspinal tenderness Heart: regular rate and rhythm Lungs: Clear to auscultation bilaterally. Vascular: No carotid bruits. Neurological Exam: Mental status: alert and oriented to person, place, and time, recent and remote memory intact, fund of knowledge intact, attention and concentration intact, speech fluent and not dysarthric, language intact. Cranial nerves: CN I: not tested CN II: pupils equal, round and reactive to light, visual fields intact, fundi unremarkable, without vessel changes, exudates, hemorrhages or papilledema. CN III, IV, VI:  full range of motion, no nystagmus, no ptosis CN V: facial sensation intact CN VII: upper and lower face symmetric CN VIII: hearing intact CN IX, X: gag intact, uvula midline CN XI: sternocleidomastoid and trapezius muscles intact CN XII: tongue midline Bulk & Tone: normal, no fasciculations. Motor:  5/5 throughout  Sensation:  Temperature and vibration intact Deep Tendon Reflexes:  2+ throughout, toes downgoing Finger to nose testing:  No dysmetria Heel to shin:  No dysmetria Gait:  Normal  station and stride.  Able to turn and walk in tandem. Romberg negative.  IMPRESSION: Migraine with visual aura.  He has had a similar episode several years ago and MRI of brain from 2013 was unremarkable.  I don't suspect other etiologies presenting as transient spells, such as TIA or seizure. Fatigue and lightheadedness.  Etiology unclear but I don't suspect it is primarily neurologic  PLAN: No further testing or treatment warranted Follow up as needed.  Thank you for allowing me to take part in the care of this patient.  Shon Millet, DO  CC:  Esperanza Richters, PA-C

## 2015-06-01 NOTE — Patient Instructions (Signed)
I believe that the episode you had was a migraine.  I wouldn't worry about it and hopefully you won't have another one.  Follow up as needed.

## 2015-06-09 ENCOUNTER — Telehealth: Payer: Self-pay | Admitting: Medical

## 2015-06-09 NOTE — Telephone Encounter (Signed)
Pt saw neurologist. No mri ordered. So will not try to get prior authorization.

## 2015-06-10 ENCOUNTER — Ambulatory Visit (INDEPENDENT_AMBULATORY_CARE_PROVIDER_SITE_OTHER): Payer: 59 | Admitting: Medical

## 2015-06-10 ENCOUNTER — Encounter: Payer: Self-pay | Admitting: Medical

## 2015-06-10 VITALS — BP 110/70 | HR 68 | Temp 98.3°F | Resp 16 | Ht 70.0 in | Wt 177.8 lb

## 2015-06-10 DIAGNOSIS — R5383 Other fatigue: Secondary | ICD-10-CM

## 2015-06-10 DIAGNOSIS — Z8659 Personal history of other mental and behavioral disorders: Secondary | ICD-10-CM | POA: Diagnosis not present

## 2015-06-10 MED ORDER — VENLAFAXINE HCL ER 37.5 MG PO CP24: 37.5000 mg | ORAL_CAPSULE | Freq: Every day | ORAL | Status: DC

## 2015-06-10 NOTE — Progress Notes (Signed)
Pre visit review using our clinic review tool, if applicable. No additional management support is needed unless otherwise documented below in the visit note. 

## 2015-06-10 NOTE — Progress Notes (Signed)
Subjective:    Patient ID: Dale Hicks, male    DOB: [birth year], 39 y.o.   MRN: [id number]  HPI   Pt in for follow up. Neurologist thought he had migraine aura preceding ha. Only happened twice. No reoccurence of his HA(and ha were mild). MRI was reviewed. And not repeated.  Pt has some daily fatigue. Normal cbc, tsh, and cmp. He bascially describes feeling very tired.  Pt states not motivated to do anything. He states played basketball the other day and felt fine playing. But then afterward felt tired.  Pt states in past other provider thought he was depressed. He used sertraline and he thought his energy improved and heavy headed sensation improved. That was a year.  Pt states he tried other meds in past and 1-2 of med may have made him anxious. He presenlty uses the same pharmacy that he used when on sertraline(and where other meds filled)    Review of Systems  Constitutional: Positive for fatigue.  Cardiovascular: Negative for chest pain and palpitations.  Gastrointestinal: Negative.   Neurological: Positive for weakness. Negative for dizziness, seizures, syncope, speech difficulty, numbness and headaches.       Heavy headed sensation at time with his fatigue.  Psychiatric/Behavioral: Positive for dysphoric mood. Negative for suicidal ideas, behavioral problems, confusion, sleep disturbance, self-injury and agitation.       Possible based on hx.   Lack of motivation. Fatigue. Does not enjoy things that he typically enjoys. Normal appetite.   Past Medical History  Diagnosis Date  . RLS (restless legs syndrome)     hx of  . Pericarditis   . Anxiety   . Depression   . Hyperlipidemia 05/20/2015    Social History   Social History  . Marital Status: Married    Spouse Name: Artist Pais  . Number of Children: 1  . Years of Education: N/A   Occupational History  . IT Analyst     [employer]    Social History Main Topics  . Smoking status: Former Smoker -- 1.00  packs/day for 7 years    Types: Cigarettes    Quit date: 01/23/2010  . Smokeless tobacco: Current User    Types: Chew     Comment: patient is aware it is not healthy for him   . Alcohol Use: 0.0 oz/week    0 Standard drinks or equivalent per week  . Drug Use: No  . Sexual Activity:    Partners: Female   Other Topics Concern  . Not on file   Social History Narrative   Regular exercise: Yes    Past Surgical History  Procedure Laterality Date  . Wisdom teeth removal    . Vasectomy      10/23/14    Family History  Problem Relation Age of Onset  . Heart attack Paternal Grandfather     GF in his 29s  . Diabetes Cousin   . Hyperlipidemia Mother   . Hypertension Mother     Allergies  Allergen Reactions  . Amitriptyline Other (See Comments)    Rapid weight gain.     Current Outpatient Prescriptions on File Prior to Visit  Medication Sig Dispense Refill  . acetaminophen (TYLENOL) 325 MG tablet Take 650 mg by mouth every 6 (six) hours as needed.    Marland Kitchen MELATONIN PO Take 1 tablet by mouth at bedtime as needed.    . fenofibrate (TRICOR) 48 MG tablet Take 1 tablet (48 mg total) by mouth daily. (Patient  not taking: Reported on 06/10/2015) 30 tablet 2   No current facility-administered medications on file prior to visit.    BP 110/70 mmHg  Pulse 68  Temp(Src) 98.3 F (36.8 C) (Oral)  Resp 16  Ht  (1.778 m)  Wt 177 lb 12.8 oz (80.65 kg)  BMI 25.51 kg/m2  SpO2 98%       Objective:   Physical Exam  General Mental Status- Alert. General Appearance- Not in acute distress.   Skin General: Color- Normal Color. Moisture- Normal Moisture.  Neck Carotid Arteries- Normal color. Moisture- Normal Moisture. No carotid bruits. No JVD.  Chest and Lung Exam Auscultation: Breath Sounds:-Normal.  Cardiovascular Auscultation:Rythm- Regular. Murmurs & Other Heart Sounds:Auscultation of the heart reveals- No Murmurs.  Abdomen Inspection:-Inspeection  Normal. Palpation/Percussion:Note:No mass. Palpation and Percussion of the abdomen reveal- Non Tender, Non Distended + BS, no rebound or guarding.    Neurologic Cranial Nerve exam:- CN III-XII intact(No nystagmus), symmetric smile. Strength:- 5/5 equal and symmetric strength both upper and lower extremities.      Assessment & Plan:  With hx of depression and fatigue with improvement while on sertraline, I do think it is good idea to rx effexor. This is similar to sertraline but has extra component that often can improve fatigue.   Note also in light of recent basic fatigue work up as negative and neurologist opinion that no diagnosis such as MS(optic issues at that time) is present, this makes me thinks trial of effexor also warranted.  Follow up in one month or as needed.

## 2015-06-10 NOTE — Patient Instructions (Addendum)
With hx of depression and fatigue with improvement while on sertraline, I do think it is good idea to rx effexor. This is similar to sertraline but has extra component that often can improve fatigue.   Note also in light of recent basic fatigue work up as negative and neurologist opinion that no diagnosis such as MS(optic issues at that time) is present, this makes me thinks trial of effexor also warranted.  Follow up in one month or as needed.

## 2015-06-18 ENCOUNTER — Telehealth: Payer: Self-pay | Admitting: Medical

## 2015-06-18 NOTE — Telephone Encounter (Signed)
°  Relation to AV:WUJW Call back number:.[phone] Pharmacy: [facility]  Reason for call: pt saw Cody on 06/10/15, pt states that the rxvenlafaxine XR (EFFEXOR-XR) 37.5 MG 24 hr capsule, makes him have anxiety attacks, dizzy, and upset stomach. Pt would like to know if there is something else he can try.

## 2015-06-21 MED ORDER — SERTRALINE HCL 25 MG PO TABS: 25.0000 mg | ORAL_TABLET | Freq: Every day | ORAL | Status: DC

## 2015-06-21 NOTE — Telephone Encounter (Signed)
Pt states side effects with effexor. He states previously no side effect with sertraline and it seemed to work. So advised will rx low dose 25 mg. He will call me in a month and update me how he is doing.

## 2015-07-12 ENCOUNTER — Ambulatory Visit: Payer: 59 | Admitting: Medical

## 2015-08-09 ENCOUNTER — Ambulatory Visit (INDEPENDENT_AMBULATORY_CARE_PROVIDER_SITE_OTHER): Payer: 59 | Admitting: Medical

## 2015-08-09 ENCOUNTER — Encounter: Payer: Self-pay | Admitting: Medical

## 2015-08-09 VITALS — BP 104/60 | HR 57 | Temp 97.8°F | Resp 16 | Ht 70.0 in | Wt 181.4 lb

## 2015-08-09 DIAGNOSIS — Z8659 Personal history of other mental and behavioral disorders: Secondary | ICD-10-CM | POA: Diagnosis not present

## 2015-08-09 MED ORDER — SERTRALINE HCL 50 MG PO TABS: 50.0000 mg | ORAL_TABLET | Freq: Every day | ORAL | Status: DC

## 2015-08-09 MED ORDER — TRAZODONE HCL 50 MG PO TABS: 25.0000 mg | ORAL_TABLET | Freq: Every evening | ORAL | Status: DC | PRN

## 2015-08-09 NOTE — Progress Notes (Signed)
Subjective:    Patient ID: Dale Hicks, male    DOB: 10-24-1975, 39 y.o.   MRN: [id number]  HPI   Pt in reports that with the sertraline he feels slight  more energetic with use of sertraline. Pt thinks may be working but he is not sure. Pt prior provider thought he was depressed. He thinks maybe slight more motivation. Pt states sleep not good but he does not sleep well due to having 2 children. Pt states not able to work on his car(which is a hobby he enjoys) since having children. Not exercising. Pt works Consulting civil engineer.   Also has trouble sleeping for past 2 years.  Pt still describes foggy head sensation. But I did refer him to neurologist. No etiology found. Prior pcp worked him up for same symptoms per his report and then decided to put him on depression.  Pt in past had echo done and ekg. This was done since sometimes he just feels weak. But those  Tests were negative.   Review of Systems  Constitutional: Negative for fever, chills, diaphoresis, activity change and fatigue.  Respiratory: Negative for cough, chest tightness and shortness of breath.   Cardiovascular: Negative for chest pain, palpitations and leg swelling.  Gastrointestinal: Negative for nausea, vomiting and abdominal pain.  Musculoskeletal: Negative for neck pain and neck stiffness.  Neurological: Negative for dizziness, syncope, speech difficulty, weakness, light-headedness and numbness.  Psychiatric/Behavioral: Positive for dysphoric mood. Negative for behavioral problems, confusion and agitation. The patient is not nervous/anxious.     Past Medical History  Diagnosis Date  . RLS (restless legs syndrome)     hx of  . Pericarditis   . Anxiety   . Depression   . Hyperlipidemia 05/20/2015    Social History   Social History  . Marital Status: Married    Spouse Name: Artist Pais  . Number of Children: 1  . Years of Education: N/A   Occupational History  . IT Analyst     [employer]    Social History Main  Topics  . Smoking status: Former Smoker -- 1.00 packs/day for 7 years    Types: Cigarettes    Quit date: 01/23/2010  . Smokeless tobacco: Current User    Types: Chew     Comment: patient is aware it is not healthy for him   . Alcohol Use: 0.0 oz/week    0 Standard drinks or equivalent per week  . Drug Use: No  . Sexual Activity:    Partners: Female   Other Topics Concern  . Not on file   Social History Narrative   Regular exercise: Yes    Past Surgical History  Procedure Laterality Date  . Wisdom teeth removal    . Vasectomy      10/23/14    Family History  Problem Relation Age of Onset  . Heart attack Paternal Grandfather     GF in his 55s  . Diabetes Cousin   . Hyperlipidemia Mother   . Hypertension Mother     Allergies  Allergen Reactions  . Amitriptyline Other (See Comments)    Rapid weight gain.     Current Outpatient Prescriptions on File Prior to Visit  Medication Sig Dispense Refill  . acetaminophen (TYLENOL) 325 MG tablet Take 650 mg by mouth every 6 (six) hours as needed.    . fenofibrate (TRICOR) 48 MG tablet Take 1 tablet (48 mg total) by mouth daily. 30 tablet 2  . MELATONIN PO Take 1 tablet  by mouth at bedtime as needed.    . sertraline (ZOLOFT) 25 MG tablet Take 1 tablet (25 mg total) by mouth at bedtime. 30 tablet 0   No current facility-administered medications on file prior to visit.    BP 104/60 mmHg  Pulse 57  Temp(Src) 97.8 F (36.6 C) (Oral)  Resp 16  Ht 5\' 10"  (1.778 m)  Wt 181 lb 6.4 oz (82.283 kg)  BMI 26.03 kg/m2  SpO2 99%       Objective:   Physical Exam  General Mental Status- Alert. General Appearance- Not in acute distress.   Skin General: Color- Normal Color. Moisture- Normal Moisture.  Neck Carotid Arteries- Normal color. Moisture- Normal Moisture. No carotid bruits. No JVD.  Chest and Lung Exam Auscultation: Breath Sounds:-Normal.  Cardiovascular Auscultation:Rythm- Regular. Murmurs & Other Heart  Sounds:Auscultation of the heart reveals- No Murmurs.  Abdomen Inspection:-Inspeection Normal. Palpation/Percussion:Note:No mass. Palpation and Percussion of the abdomen reveal- Non Tender, Non Distended + BS, no rebound or guarding.    Neurologic Cranial Nerve exam:- CN III-XII intact(No nystagmus), symmetric smile. Drift Test:- No drift. Finger to Nose:- Normal/Intact Strength:- 5/5 equal and symmetric strength both upper and lower extremities.      Assessment & Plan:  History of depression. You have some improvement but no clear if significant. Will increase sertraline to 50 mg a day.   For your insomnia-  Rx trazadone.But wait to start in about 2 wks.   Please sign release of records so we can get prior pcp notes. This may help me determine if further work up needed.  Follow up in 1 month or as needed

## 2015-08-09 NOTE — Patient Instructions (Addendum)
History of depression. You have some improvement but no clear if significant. Will increase sertraline to 50 mg a day.   For your insomnia-  Rx trazadone.But wait to start in about 2 wks.   Please sign release of records so we can get prior pcp notes. This may help me determine if further work up needed.  Follow up in 1 month or as needed

## 2015-08-09 NOTE — Progress Notes (Signed)
Pre visit review using our clinic review tool, if applicable. No additional management support is needed unless otherwise documented below in the visit note. 

## 2015-11-05 ENCOUNTER — Ambulatory Visit (INDEPENDENT_AMBULATORY_CARE_PROVIDER_SITE_OTHER): Payer: 59 | Admitting: Medical

## 2015-11-05 ENCOUNTER — Encounter: Payer: Self-pay | Admitting: Medical

## 2015-11-05 VITALS — BP 114/74 | HR 78 | Resp 16 | Ht 70.0 in | Wt 190.8 lb

## 2015-11-05 DIAGNOSIS — Z8659 Personal history of other mental and behavioral disorders: Secondary | ICD-10-CM

## 2015-11-05 DIAGNOSIS — G4700 Insomnia, unspecified: Secondary | ICD-10-CM | POA: Diagnosis not present

## 2015-11-05 DIAGNOSIS — F418 Other specified anxiety disorders: Secondary | ICD-10-CM

## 2015-11-05 MED ORDER — SERTRALINE HCL 50 MG PO TABS: 50.0000 mg | ORAL_TABLET | Freq: Every day | ORAL | Status: DC

## 2015-11-05 NOTE — Progress Notes (Signed)
Pre visit review using our clinic review tool, if applicable. No additional management support is needed unless otherwise documented below in the visit note. 

## 2015-11-05 NOTE — Progress Notes (Signed)
Subjective:    Patient ID: Dale Hicks, male    DOB: 03/17/1976, 40 y.o.   MRN: [id number]  HPI   Pt in for follow up. Pt mood is good. He feels better overall. Still sometimes feel  occasional  Tired but overall improvement of energy with sertrlaine. Some rare transient dizziness.  Not reporting anxiety.   Pt has been seen by neurologist in past for some symptoms which I thought might be neurologic in original. Dr. Everlena CooperJaffe thought to be related to migraines. Prior pcp thought he was depressed. Dr. Everlena CooperJaffe note states he could not explain all of his symptoms but he mentioned he did not suspect that there was primary neurologic type cause.Pt mri reviewed today. And reviewed Dr. Everlena CooperJaffe note.  Pt was not sleeping well. I had rx'd trazadone. He has not taken it yet.  Pt not exercising recently.      Review of Systems  Constitutional: Negative for fever, chills, diaphoresis, activity change and fatigue.  Respiratory: Negative for cough, chest tightness and shortness of breath.   Cardiovascular: Negative for chest pain, palpitations and leg swelling.  Gastrointestinal: Negative for nausea, vomiting and abdominal pain.  Musculoskeletal: Negative for neck pain and neck stiffness.  Neurological: Negative for dizziness, weakness and headaches.       Rare occasional dizzy.  Psychiatric/Behavioral: Positive for sleep disturbance and dysphoric mood. Negative for suicidal ideas, behavioral problems, confusion, self-injury and agitation. The patient is not nervous/anxious.        But a lot better per pt.   Past Medical History  Diagnosis Date  . RLS (restless legs syndrome)     hx of  . Pericarditis   . Anxiety   . Depression   . Hyperlipidemia 05/20/2015    Social History   Social History  . Marital Status: Married    Spouse Name: Artist PaisShawna  . Number of Children: 1  . Years of Education: N/A   Occupational History  . IT Analyst     [employer]    Social History Main Topics  .  Smoking status: Former Smoker -- 1.00 packs/day for 7 years    Types: Cigarettes    Quit date: 01/23/2010  . Smokeless tobacco: Current User    Types: Chew     Comment: patient is aware it is not healthy for him   . Alcohol Use: 0.0 oz/week    0 Standard drinks or equivalent per week  . Drug Use: No  . Sexual Activity:    Partners: Female   Other Topics Concern  . Not on file   Social History Narrative   Regular exercise: Yes    Past Surgical History  Procedure Laterality Date  . Wisdom teeth removal    . Vasectomy      10/23/14    Family History  Problem Relation Age of Onset  . Heart attack Paternal Grandfather     GF in his 6650s  . Diabetes Cousin   . Hyperlipidemia Mother   . Hypertension Mother     Allergies  Allergen Reactions  . Amitriptyline Other (See Comments)    Rapid weight gain.     Current Outpatient Prescriptions on File Prior to Visit  Medication Sig Dispense Refill  . acetaminophen (TYLENOL) 325 MG tablet Take 650 mg by mouth every 6 (six) hours as needed.    . fenofibrate (TRICOR) 48 MG tablet Take 1 tablet (48 mg total) by mouth daily. 30 tablet 2  . MELATONIN PO Take  1 tablet by mouth at bedtime as needed.    . traZODone (DESYREL) 50 MG tablet Take 0.5-1 tablets (25-50 mg total) by mouth at bedtime as needed for sleep. 30 tablet 3   No current facility-administered medications on file prior to visit.    BP 114/74 mmHg  Pulse 78  Resp 16  Ht 5\' 10"  (1.778 m)  Wt 190 lb 12.8 oz (86.546 kg)  BMI 27.38 kg/m2  SpO2 98%       Objective:   Physical Exam  General Mental Status- Alert. General Appearance- Not in acute distress.   Skin General: Color- Normal Color. Moisture- Normal Moisture.   Chest and Lung Exam Auscultation: Breath Sounds:-Normal.  Cardiovascular Auscultation:Rythm- Regular. Murmurs & Other Heart Sounds:Auscultation of the heart reveals- No Murmurs.  Abdomen Inspection:-Inspeection  Normal. Palpation/Percussion:Note:No mass. Palpation and Percussion of the abdomen reveal- Non Tender, Non Distended + BS, no rebound or guarding.    Neurologic Cranial Nerve exam:- CN III-XII intact(No nystagmus), symmetric smile. .      Assessment & Plan:  For your mood I would continue the sertraline. I will refill the rx today.  For your insomnia I want you to try the trazadone.  If your mood worsens or changes notify us.   Consider some daily exercise.   Follow up in 3 months or as needed  Will continue to follow pt. To see if he needs referral back to neurologist.

## 2015-11-05 NOTE — Patient Instructions (Addendum)
For your mood I would continue the sertraline. I will refill the rx today.  For your insomnia I want you to try the trazadone.  If your mood worsens or changes notify us.   Consider some daily exercise.   Follow up in 3 months or as needed

## 2015-11-08 MED ORDER — SERTRALINE HCL 50 MG PO TABS: 50.0000 mg | ORAL_TABLET | Freq: Every day | ORAL | Status: DC

## 2016-02-03 ENCOUNTER — Telehealth: Payer: Self-pay | Admitting: Medical

## 2016-02-03 ENCOUNTER — Encounter: Payer: 59 | Admitting: Medical

## 2016-02-03 NOTE — Telephone Encounter (Signed)
error:[id number] ° °

## 2016-02-03 NOTE — Progress Notes (Signed)
This encounter was created in error - please disregard.

## 2016-02-08 ENCOUNTER — Encounter: Payer: Self-pay | Admitting: Medical

## 2016-02-08 NOTE — Telephone Encounter (Signed)
No charge. 

## 2016-02-08 NOTE — Telephone Encounter (Signed)
Pt was no show 02/03/16 11:00am for f/u appt, 1st no show w/in 12 months, pt has not rescheduled, charge or no charge?

## 2016-02-08 NOTE — Telephone Encounter (Signed)
Waiving fee, mailing reminder letter °

## 2016-02-11 ENCOUNTER — Telehealth: Payer: Self-pay | Admitting: Medical

## 2016-02-11 NOTE — Telephone Encounter (Signed)
Pt is requesting a refill on his medication. He says that he will be out next week. He would like to know if he can have a supply go to his local pharmacy and the rest delivered via mail order [facility] - [place], [place] - [address].    [employer]: [place]   CB: [phone]

## 2016-02-15 NOTE — Telephone Encounter (Signed)
Attempted to reach pt and VM was full. I have sent a MyChart message to pt asking for what medication needs to be filled. Awaiting response.

## 2016-02-16 ENCOUNTER — Encounter: Payer: Self-pay | Admitting: Medical

## 2016-02-16 ENCOUNTER — Ambulatory Visit (INDEPENDENT_AMBULATORY_CARE_PROVIDER_SITE_OTHER): Payer: 59 | Admitting: Medical

## 2016-02-16 VITALS — BP 106/74 | HR 67 | Temp 97.7°F | Resp 16 | Ht 70.0 in | Wt 194.8 lb

## 2016-02-16 DIAGNOSIS — F418 Other specified anxiety disorders: Secondary | ICD-10-CM | POA: Diagnosis not present

## 2016-02-16 DIAGNOSIS — G43109 Migraine with aura, not intractable, without status migrainosus: Secondary | ICD-10-CM | POA: Diagnosis not present

## 2016-02-16 MED ORDER — SUMATRIPTAN SUCCINATE 50 MG PO TABS: 50.0000 mg | ORAL_TABLET | ORAL | Status: DC | PRN

## 2016-02-16 MED ORDER — SERTRALINE HCL 50 MG PO TABS: 50.0000 mg | ORAL_TABLET | Freq: Every day | ORAL | Status: DC

## 2016-02-16 NOTE — Telephone Encounter (Signed)
Pt has an appointment this afternoon

## 2016-02-16 NOTE — Progress Notes (Addendum)
   Subjective:    Patient ID: Dale Hicks, male    DOB: 09/26/1976, 40 y.o.   MRN: [id number]  HPI  Pt in for follow up. Pt states he is doing well with sertraline. He ran out of sertraline last night.  Pt states no side effects from meds. Feels more energetic.  Pt is not on trazadone.  Pt states at times takes quite a while to fall asleep. He states on average get 6 hours a night. Pt has 38 year old that does disrupts sleep.  Pt does admit to increased appetite. Eating more. He states since second son was born 2 years ago has gained weight.   Pt has started to exercise just recently. He is running club one time a week with his son. He just started to use his gym membership.  Pt states in past he thought maybe some high anxiety. But pt minimizes symptoms of depression. But then admits lack of motivation.  Pt only has been on seraline. Other ssri gave him side effects.  Pt did mention some bilateral visual disturbance followed by ha this past Tuesday about 8 days ago. Pt has this in past before. Neurologist thought migraine with visual aura. Pt states vision seemed blurred for 15 minutes then subsided. Then later ha. He stated he took advil and HA resolved.   This mild transient vision change with ha occurred past Tuesday. Before that was 5 months ago.   Pt had mri in 2013. Dr Everlena CooperJaffe did not think TIA or seizure. He thought migraine with aura on note review.    Review of Systems  Constitutional: Negative for fever, chills and fatigue.  Cardiovascular: Negative for chest pain and palpitations.  Gastrointestinal: Negative for abdominal pain, diarrhea and constipation.  Musculoskeletal: Negative for myalgias, back pain, gait problem and neck stiffness.  Skin: Negative for rash.  Neurological: Positive for headaches. Negative for dizziness, syncope, weakness and numbness.  Hematological: Negative for adenopathy. Does not bruise/bleed easily.  Psychiatric/Behavioral: Positive for  dysphoric mood. Negative for confusion. The patient is nervous/anxious.        He thinks maybe some anxiety. But minimizes some depression symptoms.       Objective:   Physical Exam  General Mental Status- Alert. General Appearance- Not in acute distress.   Skin General: Color- Normal Color. Moisture- Normal Moisture.  Neck Carotid Arteries- Normal color. Moisture- Normal Moisture. No carotid bruits. No JVD.  Chest and Lung Exam Auscultation: Breath Sounds:-Normal.  Cardiovascular Auscultation:Rythm- Regular. Murmurs & Other Heart Sounds:Auscultation of the heart reveals- No Murmurs.  Abdomen Inspection:-Inspeection Normal. Palpation/Percussion:Note:No mass. Palpation and Percussion of the abdomen reveal- Non Tender, Non Distended + BS, no rebound or guarding.   Neurologic Cranial Nerve exam:- CN III-XII intact(No nystagmus), symmetric smile. ric strength both upper and lower extremities.      Assessment & Plan:  For your history of depression with anxiety will refill your sertraline.   For your occasional possible migraine will rx imitrex. If your ha are more frequent or other type of symptoms as discussed  then may refer you for second opinion from neurologist. If you ever have severe neurologic type signs or symptom as advised then ED evaluation.  Follow up in 3-4 months or as needed  Dale Hicks, Dale Hicks, VF CorporationPA-C

## 2016-02-16 NOTE — Progress Notes (Signed)
Pre visit review using our clinic review tool, if applicable. No additional management support is needed unless otherwise documented below in the visit note. 

## 2016-02-16 NOTE — Patient Instructions (Addendum)
For your history of depression with anxiety will refill your sertraline.    For your occasional possible migraine will rx imitrex. If your ha are more frequent or other type of symptoms as discussed  then may refer you for second opinion from neurologist. If you ever have severe neurologic type signs or symptom as advised then ED evaluation.  Follow up in 3-4 months or as needed

## 2016-06-13 ENCOUNTER — Ambulatory Visit: Payer: 59 | Admitting: Medical

## 2016-11-02 ENCOUNTER — Encounter: Payer: Self-pay | Admitting: Family Medicine

## 2016-11-02 ENCOUNTER — Ambulatory Visit (INDEPENDENT_AMBULATORY_CARE_PROVIDER_SITE_OTHER): Payer: 59 | Admitting: Family Medicine

## 2016-11-02 VITALS — BP 114/69 | HR 72 | Wt 184.0 lb

## 2016-11-02 DIAGNOSIS — G43109 Migraine with aura, not intractable, without status migrainosus: Secondary | ICD-10-CM

## 2016-11-02 MED ORDER — SUMATRIPTAN SUCCINATE 50 MG PO TABS: 50.0000 mg | ORAL_TABLET | ORAL | 0 refills | Status: AC | PRN

## 2016-11-02 NOTE — Patient Instructions (Signed)
Thank you for coming in today. Take sumatriptan with 3 ibuprofen or 2 Aleve at the onset of the next headache. Keep a activity and food diary to see if you can identify your migraine triggers Make a follow-up appointment with Dr. Linford ArnoldMetheney in the near future.  Go to the emergency room if your headache becomes excruciating or you have weakness or numbness or uncontrolled vomiting.     Migraine Headache A migraine headache is a very strong throbbing pain on one side or both sides of your head. Migraines can also cause other symptoms. Talk with your doctor about what things may bring on (trigger) your migraine headaches. Follow these instructions at home: Medicines  Take over-the-counter and prescription medicines only as told by your doctor.  Do not drive or use heavy machinery while taking prescription pain medicine.  To prevent or treat constipation while you are taking prescription pain medicine, your doctor may recommend that you:  Drink enough fluid to keep your pee (urine) clear or pale yellow.  Take over-the-counter or prescription medicines.  Eat foods that are high in fiber. These include fresh fruits and vegetables, whole grains, and beans.  Limit foods that are high in fat and processed sugars. These include fried and sweet foods. Lifestyle  Avoid alcohol.  Do not use any products that contain nicotine or tobacco, such as cigarettes and e-cigarettes. If you need help quitting, ask your doctor.  Get at least 8 hours of sleep every night.  Limit your stress. General instructions  Keep a journal to find out what may bring on your migraines. For example, write down:  What you eat and drink.  How much sleep you get.  Any change in what you eat or drink.  Any change in your medicines.  If you have a migraine:  Avoid things that make your symptoms worse, such as bright lights.  It may help to lie down in a dark, quiet room.  Do not drive or use heavy  machinery.  Ask your doctor what activities are safe for you.  Keep all follow-up visits as told by your doctor. This is important. Contact a doctor if:  You get a migraine that is different or worse than your usual migraines. Get help right away if:  Your migraine gets very bad.  You have a fever.  You have a stiff neck.  You have trouble seeing.  Your muscles feel weak or like you cannot control them.  You start to lose your balance a lot.  You start to have trouble walking.  You pass out (faint). This information is not intended to replace advice given to you by your health care provider. Make sure you discuss any questions you have with your health care provider. Document Released: 07/18/2008 Document Revised: 04/28/2016 Document Reviewed: 03/27/2016 Elsevier Interactive Patient Education  2017 [employer]

## 2016-11-02 NOTE — Progress Notes (Signed)
Dale Hicks is a 41 y.o. male who presents to [facility] today for headache and finger tingling.  He has had headaches 1-2x/month for the past 6 months. Pain is located in the right parietal area and lasts a few hours. Prior to headache onset, he experiences 10-20 minutes of vision changes, specifically floating lights, blotchy words, and tunnel vision. Vision changes resolve when headache sets in. Headache is worse when he coughs and better when he lies down. Denies associated nausea/vomiting, photophobia, phonophobia, eye tearing or nasal congestion. No fevers/chills or weight loss. Tylenol does not help much. He has been given sumatriptan in the past but never used it.   He had a headache this morning associated with a 5-minute episode of numbness in his left fingertips. This has never happened before. No neck or arm pain.   Past Medical History:  Diagnosis Date  . Anxiety   . Depression   . Hyperlipidemia 05/20/2015  . Pericarditis   . RLS (restless legs syndrome)    hx of   Past Surgical History:  Procedure Laterality Date  . VASECTOMY     10/23/14  . wisdom teeth removal     Social History  Substance Use Topics  . Smoking status: Former Smoker    Packs/day: 1.00    Years: 7.00    Types: Cigarettes    Quit date: 01/23/2010  . Smokeless tobacco: Current User    Types: Chew     Comment: patient is aware it is not healthy for him   . Alcohol use 0.0 oz/week   family history includes Diabetes in his cousin; Heart attack in his paternal grandfather; Hyperlipidemia in his mother; Hypertension in his mother.  ROS as above:  Medications: Current Outpatient Prescriptions  Medication Sig Dispense Refill  . acetaminophen (TYLENOL) 325 MG tablet Take 650 mg by mouth every 6 (six) hours as needed.    Marland Kitchen MELATONIN PO Take 1 tablet by mouth at bedtime as needed.      . SUMAtriptan (IMITREX) 50 MG tablet Take 1 tablet (50 mg total) by mouth every 2 (two) hours as needed for migraine. May repeat in 2 hours if headache persists or recurs.(max number of tabs within any 24 hour period is 2 tabs) 10 tablet 0   No current facility-administered medications for this visit.    Allergies  Allergen Reactions  . Amitriptyline Other (See Comments)    Rapid weight gain.     Health Maintenance Health Maintenance  Topic Date Due  . HIV Screening  04/27/1993  . TETANUS/TDAP  06/04/2023  . INFLUENZA VACCINE  Addressed     Exam:  BP 114/69   Pulse 72   Wt 184 lb (83.5 kg)   BMI 26.40 kg/m  Gen: Well NAD, no photophobia during exam HEENT: EOMI, MMM, right parietal area tender to palpation Lungs: Normal work of breathing. CTABL Heart: RRR no MRG Abd: NABS, Soft. Nondistended, Nontender Exts: Brisk capillary refill, warm and well perfused.  Neuro: Cranial nerves II-XII intact, biceps and patellar reflexes 2+Normal balance and gait. Normal thought process and speech. MSK: Left hand with full strength and sensation. Negative Phalen's and Spurling's tests bilaterally.   MRI brain 05/28/12: IMPRESSION: Normal noncontrast MRI appearance of the brain.  No results found for this or any previous visit (from the past 72 hour(s)). No results found.  Depression screen [facility] 2/9 11/02/2016 04/29/2015 08/28/2013  Decreased Interest 1 0 1  Down, Depressed, Hopeless 0 0 0  PHQ - 2 Score 1 0 1  Altered sleeping 1 - 2  Tired, decreased energy 2 - 3  Change in appetite 0 - 2  Feeling bad or failure about yourself  0 - 0  Trouble concentrating 0 - 0  Moving slowly or fidgety/restless 0 - 0  Suicidal thoughts 0 - 0  PHQ-9 Score 4 - 8   GAD 7 : Generalized Anxiety Score 11/02/2016  Nervous, Anxious, on Edge 0  Control/stop worrying 0  Worry too much - different things 1  Trouble relaxing 0  Restless 1  Easily annoyed or irritable 1  Afraid - awful might happen 0   Total GAD 7 Score 3      Assessment and Plan: 41 y.o. male with Complex migraine with visual aura 1-2x/month with limited prior pharmacotherapy.  - Take sumatriptan and ibuprofen at headache onset - Activity/food diary to identify triggers  Follow up with Dr. Linford ArnoldMetheney for management of hyperlipidemia (stopped taking his statin) and depression/anxiety (well-controlled without meds).  No orders of the defined types were placed in this encounter.   Discussed warning signs or symptoms. Please see discharge instructions. Patient expresses understanding.

## 2017-01-01 ENCOUNTER — Ambulatory Visit: Payer: 59 | Admitting: Family Medicine

## 2017-06-01 ENCOUNTER — Emergency Department (INDEPENDENT_AMBULATORY_CARE_PROVIDER_SITE_OTHER)
Admission: EM | Admit: 2017-06-01 | Discharge: 2017-06-01 | Disposition: A | Discharge status: Home / Self Care | Payer: 59 | Source: Home / Self Care | Attending: Family Medicine | Admitting: Family Medicine

## 2017-06-01 ENCOUNTER — Encounter: Payer: Self-pay | Admitting: *Deleted

## 2017-06-01 DIAGNOSIS — G43009 Migraine without aura, not intractable, without status migrainosus: Secondary | ICD-10-CM | POA: Diagnosis not present

## 2017-06-01 DIAGNOSIS — Z566 Other physical and mental strain related to work: Secondary | ICD-10-CM

## 2017-06-01 DIAGNOSIS — R200 Anesthesia of skin: Secondary | ICD-10-CM

## 2017-06-01 NOTE — ED Triage Notes (Signed)
Patient reports @ 12 pm today felt as if he was getting a migraine. C/o numbness in his mouth with blurry vision and numbness to his right hand. Currently pain is 1/10, numbness in his mouth is resolved. C/o minimal numbness to left thumb and vision is still minimally altered C/o tingling in his body mostly in left arm.

## 2017-06-01 NOTE — ED Provider Notes (Signed)
[facility]    CSN: [id number] Arrival date & time: 06/01/17  1300     History   Chief Complaint Chief Complaint  Patient presents with  . Headache  . Fatigue  . Numbness    HPI Dale Hicks is a 41 y.o. male.   HPI  Dale Hicks is a 41 y.o. male presenting to UC with c/o aura around 12PM c/w prior migraines, followed by mild bilateral headache that lasted about 20-30 minutes, resolved after lying down but then he developed numbness in his mouth, blurry vision/photophobia, and numbness in his Right hand that has nearly resolved besides a small area on his Right thumb.  HA is 1/10 at this time.  Numbness in mouth has resolved. Denies SOB. Denies chest pain or palpitations but states he feels like "there is anxiety in my body, in my arms."   Pt does have a hx of anxiety and migraines but has not taken anything for anxiety in a few years and did not take any pain medication for his HA.  Pt states he has had hand numbness before but not mouth numbness with his previous migraines.  HA started while he was at work, he notes he did have a stressful morning but did get a good night's sleep and had breakfast and a snack.  Per medical records pt was seen by Dr. Denyse Amass for similar symptoms, HA and hand numbness, in January 2018 and was seen initially by his PCP Dr. Linford Arnold for a headache and numbness in 2013. He had a brain MRI at that time, which was normal.  He has not seen a neurologist or HA specialist before.    Past Medical History:  Diagnosis Date  . Anxiety   . Depression   . Hyperlipidemia 05/20/2015  . Pericarditis   . RLS (restless legs syndrome)    hx of    Patient Active Problem List   Diagnosis Date Noted  . Migraine with aura and without status migrainosus, not intractable 06/01/2015  . Lightheadedness 06/01/2015  . Hyperlipidemia 05/20/2015  . Paresthesia 04/29/2015  . Tension headache 01/30/2012  . Elevated liver enzymes 04/07/2011  . Depression with  anxiety 03/09/2011  . Fatigue 02/14/2011  . SYNDROME, RESTLESS LEGS 03/28/2007    Past Surgical History:  Procedure Laterality Date  . VASECTOMY     10/23/14  . wisdom teeth removal         Home Medications    Prior to Admission medications   Medication Sig Start Date End Date Taking? Authorizing Provider  MELATONIN PO Take 1 tablet by mouth at bedtime as needed.    [provider]  SUMAtriptan (IMITREX) 50 MG tablet Take 1 tablet (50 mg total) by mouth every 2 (two) hours as needed for migraine. May repeat in 2 hours if headache persists or recurs.(max number of tabs within any 24 hour period is 2 tabs) 11/02/16   Rodolph Bong, MD    Family History Family History  Problem Relation Age of Onset  . Heart attack Paternal Grandfather        GF in his 6s  . Diabetes Cousin   . Hyperlipidemia Mother   . Hypertension Mother     Social History Social History  Substance Use Topics  . Smoking status: Former Smoker    Packs/day: 1.00    Years: 7.00    Types: Cigarettes    Quit date: 01/23/2010  . Smokeless tobacco: Current User    Types: Chew  Comment: patient is aware it is not healthy for him   . Alcohol use 0.0 oz/week     Allergies   Amitriptyline   Review of Systems Review of Systems  Constitutional: Positive for fatigue. Negative for chills and fever.  HENT: Negative for congestion, ear pain, sore throat, trouble swallowing and voice change.   Eyes: Positive for photophobia and visual disturbance. Negative for pain.  Respiratory: Negative for cough and shortness of breath.   Cardiovascular: Negative for chest pain and palpitations.  Gastrointestinal: Negative for abdominal pain, diarrhea, nausea and vomiting.  Musculoskeletal: Negative for arthralgias, back pain and myalgias.  Skin: Negative for rash.  Neurological: Positive for numbness and headaches. Negative for dizziness, weakness and light-headedness.     Physical Exam Triage Vital  Signs ED Triage Vitals  Enc Vitals Group     BP 06/01/17 1332 106/71     Pulse Rate 06/01/17 1332 83     Resp --      Temp 06/01/17 1332 99.1 F (37.3 C)     Temp Source 06/01/17 1332 Oral     SpO2 06/01/17 1332 97 %     Weight 06/01/17 1333 159 lb (72.1 kg)     Height --      Head Circumference --      Peak Flow --      Pain Score 06/01/17 1333 1     Pain Loc --      Pain Edu? --      Excl. in GC? --    No data found.   Updated Vital Signs BP 106/71 (BP Location: Left Arm)   Pulse 83   Temp 99.1 F (37.3 C) (Oral)   Wt 159 lb (72.1 kg)   SpO2 97%   BMI 22.81 kg/m   Visual Acuity- without correction Right Eye Distance: 20/13 Left Eye Distance: 20/13 Bilateral Distance: 20/13   Physical Exam  Constitutional: He is oriented to person, place, and time. He appears well-developed and well-nourished. No distress.  HENT:  Head: Normocephalic and atraumatic.  Right Ear: Tympanic membrane normal.  Left Ear: Tympanic membrane normal.  Nose: Nose normal.  Mouth/Throat: Uvula is midline, oropharynx is clear and moist and mucous membranes are normal.  Eyes: Pupils are equal, round, and reactive to light. Conjunctivae and EOM are normal. Right eye exhibits no discharge. Left eye exhibits no discharge.  Neck: Normal range of motion. Neck supple.  Cardiovascular: Normal rate and regular rhythm.   Pulmonary/Chest: Effort normal and breath sounds normal. No respiratory distress. He has no wheezes. He has no rales.  Musculoskeletal: Normal range of motion.  Neurological: He is alert and oriented to person, place, and time.  CN II-XII in tact. Speech is clear. Alert to person, place, time, and event. Normal finger to nose coordination. Normal heal-to-toe walk. Normal gait.   Skin: Skin is warm and dry. He is not diaphoretic.  Psychiatric: He has a normal mood and affect. His behavior is normal.  Nursing note and vitals reviewed.    UC Treatments / Results  Labs (all labs  ordered are listed, but only abnormal results are displayed) Labs Reviewed - No data to display  EKG  EKG Interpretation None       Radiology No results found.  Procedures Procedures (including critical care time)  Medications Ordered in UC Medications - No data to display   Initial Impression / Assessment and Plan / UC Course  I have reviewed the triage vital signs and the nursing  notes.  Pertinent labs & imaging results that were available during my care of the patient were reviewed by me and considered in my medical decision making (see chart for details).     Reviewed PMH.  Hx and reported numbness appear similar to prior episodes of migraines.    Final Clinical Impressions(s) / UC Diagnoses   Final diagnoses:  Atypical migraine  Numbness  Stress at work   Reassured pt of normal exam. Hx c/w atypical migraine. Encouraged f/u with PCP and possible referral to neurologist or headache specialists, especially if symptoms becoming more frequent.  Discussed symptoms that warrant emergent care in the ED.   New Prescriptions Discharge Medication List as of 06/01/2017  1:54 PM       Controlled Substance Prescriptions [place] Controlled Substance Registry consulted? Not Applicable   Rolla Platehelps, Riggin Cuttino O, PA-C 06/01/17 1534

## 2017-06-02 ENCOUNTER — Telehealth: Payer: Self-pay

## 2017-06-02 NOTE — Telephone Encounter (Signed)
I called and spoke with patient and he is doing better. I advised to call back if anything changes or if he has questions or concerns.  

## 2017-07-27 DIAGNOSIS — Z23 Encounter for immunization: Secondary | ICD-10-CM | POA: Diagnosis not present

## 2018-07-25 DIAGNOSIS — Z23 Encounter for immunization: Secondary | ICD-10-CM | POA: Diagnosis not present

## 2020-01-09 ENCOUNTER — Ambulatory Visit: Payer: 59 | Attending: Internal Medicine

## 2020-01-09 DIAGNOSIS — Z23 Encounter for immunization: Secondary | ICD-10-CM

## 2020-01-09 NOTE — Progress Notes (Signed)
   Covid-19 Vaccination Clinic  Name:  GIL INGWERSEN    MRN: [id number] DOB: 09-18-1976  01/09/2020  Mr. Prabhakar was observed post Covid-19 immunization for 15 minutes without incident. He was provided with Vaccine Information Sheet and instruction to access the V-Safe system.   Mr. Deroo was instructed to call 911 with any severe reactions post vaccine: Marland Kitchen Difficulty breathing  . Swelling of face and throat  . A fast heartbeat  . A bad rash all over body  . Dizziness and weakness   Immunizations Administered    Name Date Dose VIS Date Route   Pfizer COVID-19 Vaccine 01/09/2020 12:37 PM 0.3 mL 10/03/2019 Intramuscular   Manufacturer: [employer], [employer]   Lot: [id number]   NDC: [address]-3

## 2020-02-03 ENCOUNTER — Ambulatory Visit: Payer: 59 | Attending: Internal Medicine

## 2020-02-03 DIAGNOSIS — Z23 Encounter for immunization: Secondary | ICD-10-CM

## 2020-02-03 NOTE — Progress Notes (Signed)
   Covid-19 Vaccination Clinic  Name:  Dale Hicks    MRN: [id number] DOB: July 24, 1976  02/03/2020  Mr. Dale Hicks was observed post Covid-19 immunization for 15 minutes without incident. He was provided with Vaccine Information Sheet and instruction to access the V-Safe system.   Mr. Dale Hicks was instructed to call 911 with any severe reactions post vaccine: Marland Kitchen Difficulty breathing  . Swelling of face and throat  . A fast heartbeat  . A bad rash all over body  . Dizziness and weakness   Immunizations Administered    Name Date Dose VIS Date Route   Pfizer COVID-19 Vaccine 02/03/2020  1:34 PM 0.3 mL 10/03/2019 Intramuscular   Manufacturer: [employer], [employer]   Lot: [id number]   NDC: [address]-8

## 2021-08-31 ENCOUNTER — Encounter: Payer: Self-pay | Admitting: Family Medicine

## 2021-08-31 ENCOUNTER — Ambulatory Visit (INDEPENDENT_AMBULATORY_CARE_PROVIDER_SITE_OTHER): Payer: BC Managed Care – PPO | Admitting: Family Medicine

## 2021-08-31 VITALS — BP 90/57 | HR 68 | Ht 70.0 in | Wt 170.0 lb

## 2021-08-31 DIAGNOSIS — Z114 Encounter for screening for human immunodeficiency virus [HIV]: Secondary | ICD-10-CM

## 2021-08-31 DIAGNOSIS — Z0000 Encounter for general adult medical examination without abnormal findings: Secondary | ICD-10-CM | POA: Diagnosis not present

## 2021-08-31 NOTE — Progress Notes (Signed)
Established Patient Office Visit  Subjective:  Patient ID: Dale Hicks, male    DOB: 1976/10/20  Age: 45 y.o. MRN: [id number]  CC: No chief complaint on file.   HPI Dale Hicks presents for CPE.  He is doing well.  Recently has not been exercising but normally he does.  He says his mood is good.  No recent chest pain or shortness of breath.  Tetanus is up-to-date.  Flu vaccine done about 2 weeks ago.  He does go for yearly eye exam.  Past Medical History:  Diagnosis Date   Anxiety    Depression    Hyperlipidemia 05/20/2015   Pericarditis    RLS (restless legs syndrome)    hx of    Past Surgical History:  Procedure Laterality Date   VASECTOMY     10/23/14   wisdom teeth removal      Family History  Problem Relation Age of Onset   Heart attack Paternal Grandfather        GF in his 42s   Diabetes Cousin    Hyperlipidemia Mother    Hypertension Mother     Social History   Socioeconomic History   Marital status: Married    Spouse name: Artist Pais   Number of children: 1   Years of education: Not on file   Highest education level: Not on file  Occupational History   Occupation: IT Analyst    Comment: [employer]   Tobacco Use   Smoking status: Former    Packs/day: 1.00    Years: 7.00    Pack years: 7.00    Types: Cigarettes    Quit date: 01/23/2010    Years since quitting: 11.6   Smokeless tobacco: Current    Types: Chew   Tobacco comments:    patient is aware it is not healthy for him   Substance and Sexual Activity   Alcohol use: Yes    Alcohol/week: 0.0 standard drinks   Drug use: No   Sexual activity: Yes    Partners: Female  Other Topics Concern   Not on file  Social History Narrative   Regular exercise: Yes   Social Determinants of Health   Financial Resource Strain: Not on file  Food Insecurity: Not on file  Transportation Needs: Not on file  Physical Activity: Not on file  Stress: Not on file  Social Connections: Not on file   Intimate Partner Violence: Not on file    Outpatient Medications Prior to Visit  Medication Sig Dispense Refill   MELATONIN PO Take 1 tablet by mouth at bedtime as needed.     SUMAtriptan (IMITREX) 50 MG tablet Take 1 tablet (50 mg total) by mouth every 2 (two) hours as needed for migraine. May repeat in 2 hours if headache persists or recurs.(max number of tabs within any 24 hour period is 2 tabs) 10 tablet 0   No facility-administered medications prior to visit.    Allergies  Allergen Reactions   Amitriptyline Other (See Comments)    Rapid weight gain.     ROS Review of Systems    Objective:    Physical Exam  BP (!) 90/57   Pulse 68   Ht 5\' 10"  (1.778 m)   Wt 170 lb (77.1 kg)   SpO2 100%   BMI 24.39 kg/m  Wt Readings from Last 3 Encounters:  08/31/21 170 lb (77.1 kg)  06/01/17 159 lb (72.1 kg)  11/02/16 184 lb (83.5 kg)  Health Maintenance Due  Topic Date Due   COVID-19 Vaccine (3 - Booster for Pfizer series) 03/30/2020    There are no preventive care reminders to display for this patient.  Lab Results  Component Value Date   TSH 1.87 04/29/2015   Lab Results  Component Value Date   WBC 9.3 04/29/2015   HGB 14.8 04/29/2015   HCT 43.6 04/29/2015   MCV 86.1 04/29/2015   PLT 286.0 04/29/2015   Lab Results  Component Value Date   NA 139 04/29/2015   K 4.0 04/29/2015   CO2 31 04/29/2015   GLUCOSE 78 04/29/2015   BUN 15 04/29/2015   CREATININE 1.01 04/29/2015   BILITOT 0.7 04/29/2015   ALKPHOS 68 04/29/2015   AST 25 04/29/2015   ALT 29 04/29/2015   PROT 7.4 04/29/2015   ALBUMIN 4.8 04/29/2015   CALCIUM 9.8 04/29/2015   GFR 88.34 04/29/2015   Lab Results  Component Value Date   CHOL 168 05/20/2015   Lab Results  Component Value Date   HDL 34.30 (L) 05/20/2015   Lab Results  Component Value Date   LDLCALC 89 10/28/2013   Lab Results  Component Value Date   TRIG 209.0 (H) 05/20/2015   Lab Results  Component Value Date    CHOLHDL 5 05/20/2015   No results found for: HGBA1C    Assessment & Plan:   Problem List Items Addressed This Visit   None Visit Diagnoses     Routine general medical examination at a health care facility    -  Primary   Relevant Orders   Lipid Panel w/reflex Direct LDL   COMPLETE METABOLIC PANEL WITH GFR   CBC with Differential   HIV Antibody (routine testing w rflx)   Screening for HIV without presence of risk factors       Relevant Orders   HIV Antibody (routine testing w rflx)      Keep up a regular exercise program and make sure you are eating a healthy diet Try to eat 4 servings of dairy a day, or if you are lactose intolerant take a calcium with vitamin D daily.  Your vaccines are up to date.  Will get updated lab work today. You have a form that we need to complete for his work and he will send that to [place].  No orders of the defined types were placed in this encounter.   Follow-up: Return in about 1 year (around 08/31/2022) for Wellness Exam.    Beatrice Lecher, MD

## 2021-09-01 ENCOUNTER — Telehealth: Payer: Self-pay | Admitting: *Deleted

## 2021-09-01 LAB — LIPID PANEL W/REFLEX DIRECT LDL
Cholesterol: 201 mg/dL — ABNORMAL HIGH (ref <200)
HDL: 50 mg/dL (ref >=40)
LDL Cholesterol (Calc): 132 mg/dL (calc) — ABNORMAL HIGH
Non-HDL Cholesterol (Calc): 151 mg/dL (calc) — ABNORMAL HIGH (ref <130)
Total CHOL/HDL Ratio: 4 (calc) (ref <5.0)
Triglycerides: 87 mg/dL (ref <150)

## 2021-09-01 LAB — COMPLETE METABOLIC PANEL WITH GFR
AG Ratio: 2.1 (calc) (ref 1.0–2.5)
ALT: 38 U/L (ref 9–46)
AST: 30 U/L (ref 10–40)
Albumin: 5 g/dL (ref 3.6–5.1)
Alkaline phosphatase (APISO): 80 U/L (ref 36–130)
BUN: 10 mg/dL (ref 7–25)
CO2: 27 mmol/L (ref 20–32)
Calcium: 9.8 mg/dL (ref 8.6–10.3)
Chloride: 101 mmol/L (ref 98–110)
Creat: 0.82 mg/dL (ref 0.60–1.29)
Globulin: 2.4 g/dL (calc) (ref 1.9–3.7)
Glucose, Bld: 99 mg/dL (ref 65–99)
Potassium: 4.6 mmol/L (ref 3.5–5.3)
Sodium: 140 mmol/L (ref 135–146)
Total Bilirubin: 0.8 mg/dL (ref 0.2–1.2)
Total Protein: 7.4 g/dL (ref 6.1–8.1)
eGFR: 112 mL/min/{1.73_m2} (ref >=60)

## 2021-09-01 LAB — CBC WITH DIFFERENTIAL/PLATELET
Absolute Monocytes: 597 cells/uL (ref 200–950)
Basophils Absolute: 58 cells/uL (ref 0–200)
Basophils Relative: 1 %
Eosinophils Absolute: 331 cells/uL (ref 15–500)
Eosinophils Relative: 5.7 %
HCT: 46 % (ref 38.5–50.0)
Hemoglobin: 15.7 g/dL (ref 13.2–17.1)
Lymphs Abs: 1769 cells/uL (ref 850–3900)
MCH: 29.8 pg (ref 27.0–33.0)
MCHC: 34.1 g/dL (ref 32.0–36.0)
MCV: 87.3 fL (ref 80.0–100.0)
MPV: 9.1 fL (ref 7.5–12.5)
Monocytes Relative: 10.3 %
Neutro Abs: 3045 cells/uL (ref 1500–7800)
Neutrophils Relative %: 52.5 %
Platelets: 258 10*3/uL (ref 140–400)
RBC: 5.27 10*6/uL (ref 4.20–5.80)
RDW: 12.2 % (ref 11.0–15.0)
Total Lymphocyte: 30.5 %
WBC: 5.8 10*3/uL (ref 3.8–10.8)

## 2021-09-01 NOTE — Progress Notes (Signed)
Hi Shanna, your blood count is normal.  Total cholesterol and LDL are elevated.  Encourage regular exercise and healthy diet to bring that down.  Your complete metabolic panel looks great.

## 2021-09-01 NOTE — Telephone Encounter (Signed)
Form completed,faxed,confirmation received and scanned into patient's chart. 

## 2023-08-08 DIAGNOSIS — Z713 Dietary counseling and surveillance: Secondary | ICD-10-CM | POA: Diagnosis not present

## 2023-08-08 DIAGNOSIS — Z6824 Body mass index (BMI) 24.0-24.9, adult: Secondary | ICD-10-CM | POA: Diagnosis not present

## 2023-08-08 DIAGNOSIS — Z131 Encounter for screening for diabetes mellitus: Secondary | ICD-10-CM | POA: Diagnosis not present

## 2023-08-08 DIAGNOSIS — Z136 Encounter for screening for cardiovascular disorders: Secondary | ICD-10-CM | POA: Diagnosis not present

## 2023-08-08 DIAGNOSIS — Z13220 Encounter for screening for lipoid disorders: Secondary | ICD-10-CM | POA: Diagnosis not present

## 2024-02-12 DIAGNOSIS — M79671 Pain in right foot: Secondary | ICD-10-CM | POA: Diagnosis not present

## 2024-02-12 DIAGNOSIS — M2021 Hallux rigidus, right foot: Secondary | ICD-10-CM | POA: Diagnosis not present

## 2024-05-22 ENCOUNTER — Encounter: Payer: Self-pay | Admitting: Family Medicine

## 2024-05-22 ENCOUNTER — Ambulatory Visit (INDEPENDENT_AMBULATORY_CARE_PROVIDER_SITE_OTHER): Admitting: Family Medicine

## 2024-05-22 VITALS — BP 120/70 | HR 70 | Ht 70.0 in | Wt 166.0 lb

## 2024-05-22 DIAGNOSIS — M7711 Lateral epicondylitis, right elbow: Secondary | ICD-10-CM

## 2024-05-22 DIAGNOSIS — Z23 Encounter for immunization: Secondary | ICD-10-CM | POA: Diagnosis not present

## 2024-05-22 DIAGNOSIS — Z1211 Encounter for screening for malignant neoplasm of colon: Secondary | ICD-10-CM

## 2024-05-22 MED ORDER — MELOXICAM 15 MG PO TABS: 15.0000 mg | ORAL_TABLET | Freq: Every day | ORAL | 1 refills | Status: AC

## 2024-05-22 NOTE — Progress Notes (Signed)
   Acute Office Visit  Subjective:     Patient ID: Dale Hicks, male    DOB: 07-May-1976, 48 y.o.   MRN: [id number]  Chief Complaint  Patient presents with   Elbow Pain    Right elbow pain onset 3 mo    HPI Patient is in today for right elbow pain x 3 months.  No trauma or injury.  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  It has been bothering him pretty persistently.  Even just spraying a can of sunscreen he can feel it shoot down his arm towards his elbow.  He has tried some heat and ice but not really anything else.    ROS      Objective:    BP 120/70   Pulse 70   Ht 5' 10 (1.778 m)   Wt 166 lb (75.3 kg)   SpO2 99%   BMI 23.82 kg/m    Physical Exam Vitals reviewed.  Constitutional:      Appearance: Normal appearance.  HENT:     Head: Normocephalic.  Pulmonary:     Effort: Pulmonary effort is normal.  Musculoskeletal:     Comments: Over the lateral epicondyle.  More pain with pronation against resistance and supination.  Elbow with normal range of motion no crepitus.  Nontender over the olecranon.  Strength is 5 out of 5 at the elbow  Neurological:     Mental Status: He is alert and oriented to person, place, and time.  Psychiatric:        Mood and Affect: Mood normal.        Behavior: Behavior normal.     No results found for any visits on 05/22/24.      Assessment & Plan:   Problem List Items Addressed This Visit   None Visit Diagnoses       Lateral epicondylitis of right elbow    -  Primary     Immunization due       Relevant Orders   Tdap vaccine greater than or equal to 7yo IM (Completed)     Screen for colon cancer       Relevant Orders   Ambulatory referral to Gastroenterology      Symptoms most consistent with lateral epicondylitis of the right elbow-discussed diagnosis.  Recommend icing, elbow strap, anti-inflammatory for at least 2 to 3 weeks.  Given copy of rehab exercises to do on his  own at home.  If not improving return for possible injection.  Given Tdap today.   We did also refer him today for colon cancer screening since he is over 45.   Meds ordered this encounter  Medications   meloxicam  (MOBIC ) 15 MG tablet    Sig: Take 1 tablet (15 mg total) by mouth daily.    Dispense:  30 tablet    Refill:  1    No follow-ups on file.  Dorothyann Byars, MD

## 2024-05-22 NOTE — Patient Instructions (Addendum)
 Pick up a tennis elbow strap.  See handout for exercises.  Ice daily  If not improving over 3-4 weeks return for injection

## 2024-07-31 DIAGNOSIS — Z131 Encounter for screening for diabetes mellitus: Secondary | ICD-10-CM | POA: Diagnosis not present

## 2024-07-31 DIAGNOSIS — Z136 Encounter for screening for cardiovascular disorders: Secondary | ICD-10-CM | POA: Diagnosis not present

## 2024-07-31 DIAGNOSIS — E785 Hyperlipidemia, unspecified: Secondary | ICD-10-CM | POA: Diagnosis not present

## 2024-07-31 DIAGNOSIS — Z13220 Encounter for screening for lipoid disorders: Secondary | ICD-10-CM | POA: Diagnosis not present

## 2024-07-31 DIAGNOSIS — Z6823 Body mass index (BMI) 23.0-23.9, adult: Secondary | ICD-10-CM | POA: Diagnosis not present

## 2024-07-31 DIAGNOSIS — Z713 Dietary counseling and surveillance: Secondary | ICD-10-CM | POA: Diagnosis not present

## 2024-08-15 DIAGNOSIS — Z1211 Encounter for screening for malignant neoplasm of colon: Secondary | ICD-10-CM | POA: Diagnosis not present

## 2024-08-15 DIAGNOSIS — D122 Benign neoplasm of ascending colon: Secondary | ICD-10-CM | POA: Diagnosis not present
